# Patient Record
Sex: Female | Born: 1985 | Race: White | Hispanic: No | Marital: Married | Smoking: Former smoker
Health system: Southern US, Community
[De-identification: ages and names within clinical notes are randomized; demographics above are authoritative.]

## PROBLEM LIST (undated history)

## (undated) ENCOUNTER — Inpatient Hospital Stay (HOSPITAL_COMMUNITY): Payer: Self-pay

## (undated) DIAGNOSIS — R569 Unspecified convulsions: Secondary | ICD-10-CM

## (undated) DIAGNOSIS — I10 Essential (primary) hypertension: Secondary | ICD-10-CM

## (undated) DIAGNOSIS — F32A Depression, unspecified: Secondary | ICD-10-CM

## (undated) HISTORY — PX: INNER EAR SURGERY: SHX679

## (undated) HISTORY — PX: NO PAST SURGERIES: SHX2092

---
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
## 2003-01-12 ENCOUNTER — Emergency Department (HOSPITAL_COMMUNITY): Admission: EM | Admit: 2003-01-12 | Discharge: 2003-01-12 | Payer: Self-pay | Admitting: Emergency Medicine

## 2003-01-14 ENCOUNTER — Emergency Department (HOSPITAL_COMMUNITY): Admission: EM | Admit: 2003-01-14 | Discharge: 2003-01-15 | Payer: Self-pay | Admitting: Emergency Medicine

## 2003-03-15 ENCOUNTER — Emergency Department (HOSPITAL_COMMUNITY): Admission: EM | Admit: 2003-03-15 | Discharge: 2003-03-15 | Payer: Self-pay | Admitting: Emergency Medicine

## 2003-03-16 ENCOUNTER — Emergency Department (HOSPITAL_COMMUNITY): Admission: EM | Admit: 2003-03-16 | Discharge: 2003-03-17 | Payer: Self-pay | Admitting: Emergency Medicine

## 2003-03-17 ENCOUNTER — Emergency Department (HOSPITAL_COMMUNITY): Admission: EM | Admit: 2003-03-17 | Discharge: 2003-03-17 | Payer: Self-pay | Admitting: Emergency Medicine

## 2003-04-28 ENCOUNTER — Emergency Department (HOSPITAL_COMMUNITY): Admission: EM | Admit: 2003-04-28 | Discharge: 2003-04-28 | Payer: Self-pay | Admitting: Emergency Medicine

## 2003-05-04 ENCOUNTER — Emergency Department (HOSPITAL_COMMUNITY): Admission: EM | Admit: 2003-05-04 | Discharge: 2003-05-04 | Payer: Self-pay | Admitting: Emergency Medicine

## 2003-05-29 ENCOUNTER — Emergency Department (HOSPITAL_COMMUNITY): Admission: EM | Admit: 2003-05-29 | Discharge: 2003-05-29 | Payer: Self-pay | Admitting: Emergency Medicine

## 2003-08-29 ENCOUNTER — Emergency Department (HOSPITAL_COMMUNITY): Admission: EM | Admit: 2003-08-29 | Discharge: 2003-08-29 | Payer: Self-pay | Admitting: Emergency Medicine

## 2003-10-19 ENCOUNTER — Emergency Department (HOSPITAL_COMMUNITY): Admission: EM | Admit: 2003-10-19 | Discharge: 2003-10-19 | Payer: Self-pay | Admitting: Emergency Medicine

## 2003-11-11 ENCOUNTER — Emergency Department (HOSPITAL_COMMUNITY): Admission: EM | Admit: 2003-11-11 | Discharge: 2003-11-11 | Payer: Self-pay | Admitting: Emergency Medicine

## 2004-05-06 ENCOUNTER — Emergency Department (HOSPITAL_COMMUNITY): Admission: EM | Admit: 2004-05-06 | Discharge: 2004-05-06 | Payer: Self-pay | Admitting: Emergency Medicine

## 2004-09-04 ENCOUNTER — Emergency Department (HOSPITAL_COMMUNITY): Admission: EM | Admit: 2004-09-04 | Discharge: 2004-09-04 | Payer: Self-pay | Admitting: *Deleted

## 2004-10-10 ENCOUNTER — Emergency Department (HOSPITAL_COMMUNITY): Admission: EM | Admit: 2004-10-10 | Discharge: 2004-10-10 | Payer: Self-pay | Admitting: Emergency Medicine

## 2005-01-03 ENCOUNTER — Emergency Department (HOSPITAL_COMMUNITY): Admission: EM | Admit: 2005-01-03 | Discharge: 2005-01-04 | Payer: Self-pay | Admitting: Emergency Medicine

## 2005-04-16 ENCOUNTER — Emergency Department (HOSPITAL_COMMUNITY): Admission: EM | Admit: 2005-04-16 | Discharge: 2005-04-17 | Payer: Self-pay | Admitting: Emergency Medicine

## 2005-08-23 ENCOUNTER — Ambulatory Visit: Payer: Self-pay | Admitting: Psychiatry

## 2005-08-23 ENCOUNTER — Inpatient Hospital Stay (HOSPITAL_COMMUNITY): Admission: AD | Admit: 2005-08-23 | Discharge: 2005-08-26 | Payer: Self-pay | Admitting: Psychiatry

## 2005-08-27 ENCOUNTER — Emergency Department (HOSPITAL_COMMUNITY): Admission: EM | Admit: 2005-08-27 | Discharge: 2005-08-27 | Payer: Self-pay | Admitting: *Deleted

## 2005-08-28 ENCOUNTER — Emergency Department (HOSPITAL_COMMUNITY): Admission: EM | Admit: 2005-08-28 | Discharge: 2005-08-29 | Payer: Self-pay | Admitting: Emergency Medicine

## 2007-02-09 ENCOUNTER — Inpatient Hospital Stay (HOSPITAL_COMMUNITY): Admission: AD | Admit: 2007-02-09 | Discharge: 2007-02-09 | Payer: Self-pay | Admitting: Obstetrics and Gynecology

## 2007-07-08 ENCOUNTER — Inpatient Hospital Stay (HOSPITAL_COMMUNITY): Admission: AD | Admit: 2007-07-08 | Discharge: 2007-07-08 | Payer: Self-pay | Admitting: Obstetrics and Gynecology

## 2007-08-03 ENCOUNTER — Inpatient Hospital Stay (HOSPITAL_COMMUNITY): Admission: AD | Admit: 2007-08-03 | Discharge: 2007-08-03 | Payer: Self-pay | Admitting: Obstetrics and Gynecology

## 2007-08-29 ENCOUNTER — Inpatient Hospital Stay (HOSPITAL_COMMUNITY): Admission: AD | Admit: 2007-08-29 | Discharge: 2007-09-01 | Payer: Self-pay | Admitting: Obstetrics and Gynecology

## 2008-08-25 ENCOUNTER — Emergency Department (HOSPITAL_COMMUNITY): Admission: EM | Admit: 2008-08-25 | Discharge: 2008-08-25 | Payer: Self-pay | Admitting: Emergency Medicine

## 2008-10-11 ENCOUNTER — Emergency Department (HOSPITAL_COMMUNITY): Admission: EM | Admit: 2008-10-11 | Discharge: 2008-10-11 | Payer: Self-pay | Admitting: Emergency Medicine

## 2008-10-11 ENCOUNTER — Emergency Department (HOSPITAL_COMMUNITY): Admission: EM | Admit: 2008-10-11 | Discharge: 2008-10-12 | Payer: Self-pay | Admitting: Emergency Medicine

## 2009-03-03 ENCOUNTER — Emergency Department (HOSPITAL_COMMUNITY): Admission: EM | Admit: 2009-03-03 | Discharge: 2009-03-03 | Payer: Self-pay | Admitting: Emergency Medicine

## 2009-03-05 ENCOUNTER — Emergency Department (HOSPITAL_BASED_OUTPATIENT_CLINIC_OR_DEPARTMENT_OTHER): Admission: EM | Admit: 2009-03-05 | Discharge: 2009-03-05 | Payer: Self-pay | Admitting: Emergency Medicine

## 2009-05-17 ENCOUNTER — Emergency Department (HOSPITAL_COMMUNITY): Admission: EM | Admit: 2009-05-17 | Discharge: 2009-05-17 | Payer: Self-pay | Admitting: Emergency Medicine

## 2010-06-11 NOTE — Discharge Summary (Signed)
Angel Carpenter, Angel Carpenter                 ACCOUNT NO.:  [id number]   MEDICAL RECORD NO.:  [id number]          PATIENT TYPE:  IPS   LOCATION:  0507                          FACILITY:  [facility]   PHYSICIAN:  Anselm Jungling, MD  DATE OF BIRTH:  [birth year]   DATE OF ADMISSION:  08/23/2005  DATE OF DISCHARGE:  08/26/2005                                 DISCHARGE SUMMARY   IDENTIFYING DATA AND REASON FOR ADMISSION:  The patient is a 25 year old  single white female who was admitted after an overdose that she described  only as an effort to sleep and to get attention.  She denied it was an  attempt to end her life.  She did admit to depression.  She had not had any  outpatient treatment for this.  She indicated that she wanted counseling.  She denied suicidal ideation.  She had been living with her mother.  Please  refer to the admission note for further details pertaining to the symptoms,  circumstances and history that led to her hospitalization.  She was given an  initial Axis I diagnosis of rule out major depressive disorder.   MEDICAL AND LABORATORY:  The patient was medically and physically assessed  by the psychiatric nurse practitioner.  She was in good health, but felt to  likely have a urinary tract infection.  For this she was prescribed a 7-day  course of Keflex 500 mg b.i.d..   HOSPITAL COURSE:  The patient was admitted to the adult inpatient  psychiatric service.  She presented as a well-nourished, well-developed  young woman who was pleasant, fully oriented, cooperative and appropriate.  Her mood was depressed with sad affect.  There were no signs or symptoms of  psychosis or thought disorder.  She was absent suicidal ideation throughout  her inpatient stay and regretted her overdose.   The patient initially agreed to allow [place] to contact her mother, who  she had  been living with, but on the second hospital day, her mother indicated that  she had kicked me out of her home, and  stated that this was because she  was dating an [place] American female.  Jakaria made plans to stay with a  girlfriend.  She also was future oriented, in talking about a desire to get  her GED degree and an accounting degree.   She was a good participant in group therapy and got along with peers and  staff alike.   Raya was clear throughout her inpatient stay that she was not really  interested in antidepressant medication trials.  She did indicate  consistently that she wanted individual counseling.   On the fourth hospital day, the patient appeared to be appropriate for  discharge.   AFTERCARE:  The patient was to follow-up with [facility]  mental health  center with an appointment on August 29, 2005, for individual counseling and  other appropriate services.   DISCHARGE MEDICATIONS:  Keflex 500 mg b.i.d. on August 3 through August 7,  then discontinue.  Follow-up with family doctor regarding any further  urinary tract symptoms.  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  AXIS II: Deferred.  AXIS III: Urinary tract infection, resolving.  AXIS IV: Stressors severe.  AXIS V: Global assessment of functioning on discharge 63.           ______________________________  Anselm Jungling, MD  Electronically Signed     SPB/MEDQ  D:  08/29/2005  T:  08/29/2005  Job:  [id number]

## 2010-10-15 LAB — URINALYSIS, ROUTINE W REFLEX MICROSCOPIC
Bilirubin Urine: NEGATIVE
Glucose, UA: NEGATIVE
Ketones, ur: NEGATIVE
Nitrite: NEGATIVE
Protein, ur: 30 — AB
Specific Gravity, Urine: 1.025
Urobilinogen, UA: 0.2
pH: 7

## 2010-10-15 LAB — URINE CULTURE
Colony Count: NO GROWTH
Culture: NO GROWTH

## 2010-10-22 LAB — CBC
HCT: 30 — ABNORMAL LOW
MCHC: 34.2
MCV: 94.5
Platelets: 174
RBC: 3.17 — ABNORMAL LOW
RDW: 14
WBC: 13.8 — ABNORMAL HIGH

## 2010-11-21 ENCOUNTER — Emergency Department (HOSPITAL_COMMUNITY)
Admission: EM | Admit: 2010-11-21 | Discharge: 2010-11-21 | Disposition: A | Discharge status: Home / Self Care | Payer: Self-pay | Attending: Emergency Medicine | Admitting: Emergency Medicine

## 2010-11-21 DIAGNOSIS — F17200 Nicotine dependence, unspecified, uncomplicated: Secondary | ICD-10-CM | POA: Insufficient documentation

## 2010-11-21 DIAGNOSIS — N390 Urinary tract infection, site not specified: Secondary | ICD-10-CM | POA: Insufficient documentation

## 2010-11-21 LAB — URINALYSIS, ROUTINE W REFLEX MICROSCOPIC
Glucose, UA: NEGATIVE mg/dL
Ketones, ur: 40 mg/dL — AB
Specific Gravity, Urine: 1.031 — ABNORMAL HIGH (ref 1.005–1.030)
Urobilinogen, UA: 1 mg/dL (ref 0.0–1.0)
pH: 6 (ref 5.0–8.0)

## 2010-11-21 LAB — CBC
Hemoglobin: 13.5 g/dL (ref 12.0–15.0)
RDW: 11.9 % (ref 11.5–15.5)
WBC: 9.3 10*3/uL (ref 4.0–10.5)

## 2010-11-21 LAB — DIFFERENTIAL
Eosinophils Absolute: 0.1 10*3/uL (ref 0.0–0.7)
Eosinophils Relative: 1 % (ref 0–5)
Lymphocytes Relative: 36 % (ref 12–46)
Monocytes Relative: 5 % (ref 3–12)
Neutrophils Relative %: 58 % (ref 43–77)

## 2010-11-21 LAB — BASIC METABOLIC PANEL
BUN: 14 mg/dL (ref 6–23)
Chloride: 99 mEq/L (ref 96–112)
GFR calc Af Amer: >90 mL/min (ref >90)
GFR calc non Af Amer: >90 mL/min (ref >90)
Glucose, Bld: 82 mg/dL (ref 70–99)

## 2010-11-21 LAB — URINE MICROSCOPIC-ADD ON

## 2010-11-23 LAB — URINE CULTURE

## 2011-03-01 ENCOUNTER — Encounter (HOSPITAL_COMMUNITY): Payer: Self-pay | Admitting: Emergency Medicine

## 2011-03-01 ENCOUNTER — Emergency Department (HOSPITAL_COMMUNITY)
Admission: EM | Admit: 2011-03-01 | Discharge: 2011-03-01 | Disposition: A | Discharge status: Home / Self Care | Payer: Self-pay | Attending: Emergency Medicine | Admitting: Emergency Medicine

## 2011-03-01 DIAGNOSIS — W260XXA Contact with knife, initial encounter: Secondary | ICD-10-CM | POA: Insufficient documentation

## 2011-03-01 DIAGNOSIS — W261XXA Contact with sword or dagger, initial encounter: Secondary | ICD-10-CM | POA: Insufficient documentation

## 2011-03-01 DIAGNOSIS — S61209A Unspecified open wound of unspecified finger without damage to nail, initial encounter: Secondary | ICD-10-CM | POA: Insufficient documentation

## 2011-03-01 DIAGNOSIS — S61218A Laceration without foreign body of other finger without damage to nail, initial encounter: Secondary | ICD-10-CM

## 2011-03-01 MED ORDER — TETANUS-DIPHTH-ACELL PERTUSSIS 5-2.5-18.5 LF-MCG/0.5 IM SUSP
0.5000 mL | Freq: Once | INTRAMUSCULAR | Status: AC
  Administered 2011-03-01: 0.5 mL via INTRAMUSCULAR
  Filled 2011-03-01: qty 0.5

## 2011-03-01 NOTE — ED Notes (Signed)
Bleeding controlled at present. 

## 2011-03-01 NOTE — ED Provider Notes (Signed)
Medical screening examination/treatment/procedure(s) were performed by non-physician practitioner and as supervising physician I was immediately available for consultation/collaboration.   Lyanne Co, MD 03/01/11 [phone]

## 2011-03-01 NOTE — ED Provider Notes (Signed)
History     CSN: [id number]  Arrival date & time 03/01/11  1326   First MD Initiated Contact with Patient 03/01/11 1328      2:22 PM HPI Patient reports yesterday cut her left fourth finger on a box cutter. Patient has a small half inch skin flap that is persistently oozing. Patient reports laceration occurred over 24 hours ago. Patient is a 26 y.o. female presenting with skin laceration. The history is provided by the patient.  Laceration  The incident occurred yesterday. The laceration is 1 cm in size. The laceration mechanism was a a dirty knife. The pain is at a severity of 3/10. The pain is mild. The pain has been constant since onset. She reports no foreign bodies present. Her tetanus status is unknown.    History reviewed. No pertinent past medical history.  History reviewed. No pertinent past surgical history.  Family History  Problem Relation Age of Onset  . Diabetes Father     History  Substance Use Topics  . Smoking status: Current Everyday Smoker  . Smokeless tobacco: Not on file  . Alcohol Use: No    OB History    Grav Para Term Preterm Abortions TAB SAB Ect Mult Living                  Review of Systems  Skin: Negative for color change.       Bleeding Laceration  All other systems reviewed and are negative.    Allergies  Sulfa antibiotics  Home Medications   Current Outpatient Rx  Name Route Sig Dispense Refill  . HYDROCODONE-ACETAMINOPHEN 7.5-325 MG PO TABS Oral Take 1 tablet by mouth every 6 (six) hours as needed. For pain    . IBUPROFEN 200 MG PO TABS Oral Take 400 mg by mouth every 6 (six) hours as needed. For pain    . BACITRACIN-NEOMYCIN-POLYMYXIN 400-05-4998 EX OINT Topical Apply 1 application topically every 12 (twelve) hours.      BP 124/85  Pulse 95  Temp(Src) 98.2 F (36.8 C) (Oral)  Resp 18  SpO2 100%  LMP 03/01/2011  Physical Exam  Vitals reviewed. Constitutional: She is oriented to person, place, and time. Vital signs are  normal. She appears well-developed and well-nourished. No distress.  HENT:  Head: Normocephalic and atraumatic.  Eyes: Pupils are equal, round, and reactive to light.  Neck: Neck supple.  Pulmonary/Chest: Effort normal.  Neurological: She is alert and oriented to person, place, and time.  Skin: Skin is warm and dry. No rash noted. No erythema. No pallor.       Left fourth distal phalanx has a partial avulsion of the distal tip. Laceration approximately half a centimeter. Mildly tender to palpation minimal amount of blood oozing. Tender to palpation. Patient has normal sensation, capillary refill, pulses, strength. Patient also has range of motion  Psychiatric: She has a normal mood and affect. Her behavior is normal.    ED Course  Procedures   MDM   Placed Wound seal on wound. Hemostasis instantly achieved. Will wrap wound with a slight pressure dressing . Advised patient to keep a dressing for 7 days to ensure adherence of flap.       Thomasene Lot, PA-C 03/01/11 1434

## 2011-09-14 ENCOUNTER — Emergency Department (HOSPITAL_BASED_OUTPATIENT_CLINIC_OR_DEPARTMENT_OTHER)
Admission: EM | Admit: 2011-09-14 | Discharge: 2011-09-14 | Disposition: A | Discharge status: Home / Self Care | Payer: Self-pay | Attending: Emergency Medicine | Admitting: Emergency Medicine

## 2011-09-14 ENCOUNTER — Encounter (HOSPITAL_BASED_OUTPATIENT_CLINIC_OR_DEPARTMENT_OTHER): Payer: Self-pay | Admitting: *Deleted

## 2011-09-14 DIAGNOSIS — F17200 Nicotine dependence, unspecified, uncomplicated: Secondary | ICD-10-CM | POA: Insufficient documentation

## 2011-09-14 DIAGNOSIS — Z882 Allergy status to sulfonamides status: Secondary | ICD-10-CM | POA: Insufficient documentation

## 2011-09-14 DIAGNOSIS — L509 Urticaria, unspecified: Secondary | ICD-10-CM | POA: Insufficient documentation

## 2011-09-14 MED ORDER — PREDNISONE 50 MG PO TABS: 60.0000 mg | ORAL_TABLET | Freq: Once | ORAL | Status: DC

## 2011-09-14 MED ORDER — DIPHENHYDRAMINE HCL 25 MG PO CAPS: 50.0000 mg | ORAL_CAPSULE | Freq: Once | ORAL | Status: DC

## 2011-09-14 MED ORDER — PREDNISONE 10 MG PO TABS: 20.0000 mg | ORAL_TABLET | Freq: Two times a day (BID) | ORAL | Status: AC

## 2011-09-14 MED ORDER — DIPHENHYDRAMINE HCL 25 MG PO CAPS
50.0000 mg | ORAL_CAPSULE | Freq: Once | ORAL | Status: AC
  Administered 2011-09-14: 50 mg via ORAL
  Filled 2011-09-14: qty 2

## 2011-09-14 MED ORDER — PREDNISONE 50 MG PO TABS
60.0000 mg | ORAL_TABLET | Freq: Once | ORAL | Status: AC
  Administered 2011-09-14: 60 mg via ORAL
  Filled 2011-09-14: qty 1

## 2011-09-14 NOTE — ED Provider Notes (Signed)
History     CSN: [id number]  Arrival date & time 09/14/11  1104   First MD Initiated Contact with Patient 09/14/11 1128      Chief Complaint  Patient presents with  . Rash    (Consider location/radiation/quality/duration/timing/severity/associated sxs/prior treatment) HPI  Complaining of hives diffusely to upper extremities and legs. She states that she has had hives multiple times in the past. She did sleep on the sheets that she didn't wash last night. She has not had any throat swelling, nasal discharge, cough, dyspnea, lightheadedness, or GI distress. She has not had any B. symptoms in the past. She does have a history of having hives approximately 10 times in the past. She has not followed up with an allergist. She has never had a progression of her symptoms and they have generally resolved with treatment with prednisone and then a drill. She did not take any medications at home prior to coming in.  History reviewed. No pertinent past medical history.  Past Surgical History  Procedure Date  . Inner ear surgery     Left    Family History  Problem Relation Age of Onset  . Diabetes Father     History  Substance Use Topics  . Smoking status: Current Everyday Smoker  . Smokeless tobacco: Not on file  . Alcohol Use: Yes    OB History    Grav Para Term Preterm Abortions TAB SAB Ect Mult Living                  Review of Systems  All other systems reviewed and are negative.    Allergies  Sulfa antibiotics  Home Medications   Current Outpatient Rx  Name Route Sig Dispense Refill  . HYDROCODONE-ACETAMINOPHEN 7.5-325 MG PO TABS Oral Take 1 tablet by mouth every 6 (six) hours as needed. For pain    . IBUPROFEN 200 MG PO TABS Oral Take 400 mg by mouth every 6 (six) hours as needed. For pain    . BACITRACIN-NEOMYCIN-POLYMYXIN 400-05-4998 EX OINT Topical Apply 1 application topically every 12 (twelve) hours.      BP 130/80  Pulse 100  Temp 97.8 F (36.6 C) (Oral)   Resp 18  Ht 5\' 8"  (1.727 m)  Wt 165 lb (74.844 kg)  BMI 25.09 kg/m2  SpO2 100%  Physical Exam  Nursing note and vitals reviewed. Constitutional: She appears well-developed and well-nourished.  HENT:  Head: Normocephalic and atraumatic.  Eyes: Conjunctivae and EOM are normal. Pupils are equal, round, and reactive to light.  Neck: Normal range of motion. Neck supple.  Cardiovascular: Normal rate, regular rhythm, normal heart sounds and intact distal pulses.   Pulmonary/Chest: Effort normal and breath sounds normal.  Abdominal: Soft. Bowel sounds are normal.  Musculoskeletal: Normal range of motion.  Neurological: She is alert.  Skin: Skin is warm and dry.       Hives on legs and arms.  Psychiatric: She has a normal mood and affect. Thought content normal.    ED Course  Procedures (including critical care time)  Labs Reviewed - No data to display No results found.   No diagnosis found.    MDM  Patient with some slight decrease in hives. She has not had any spreading continues to have normal respirations and circulatory exam. She received by mouth prednisone and Benadryl here. I discussed this with both her and her husband. She'll continue on prednisone and Benadryl. She'll be given a referral to an allergist  Hilario Quarry, MD 09/14/11 [phone]

## 2011-09-14 NOTE — ED Notes (Signed)
Patient signed safety contract and states he has no intention of harming himself or anyone else.

## 2011-09-14 NOTE — ED Notes (Signed)
Patient states hives started this morning; she also reports pain with the hives, all over her body.  Patient reports having new sheets on the bed last night, and didn't wash them prior to use.

## 2011-09-14 NOTE — ED Notes (Signed)
Patient reports rash to entire  body, red bumps, no respiratory distress

## 2011-12-13 ENCOUNTER — Emergency Department (HOSPITAL_BASED_OUTPATIENT_CLINIC_OR_DEPARTMENT_OTHER)
Admission: EM | Admit: 2011-12-13 | Discharge: 2011-12-13 | Disposition: A | Discharge status: Home / Self Care | Payer: Self-pay | Attending: Emergency Medicine | Admitting: Emergency Medicine

## 2011-12-13 ENCOUNTER — Encounter (HOSPITAL_BASED_OUTPATIENT_CLINIC_OR_DEPARTMENT_OTHER): Payer: Self-pay | Admitting: *Deleted

## 2011-12-13 DIAGNOSIS — F17200 Nicotine dependence, unspecified, uncomplicated: Secondary | ICD-10-CM | POA: Insufficient documentation

## 2011-12-13 DIAGNOSIS — B009 Herpesviral infection, unspecified: Secondary | ICD-10-CM | POA: Insufficient documentation

## 2011-12-13 DIAGNOSIS — B001 Herpesviral vesicular dermatitis: Secondary | ICD-10-CM

## 2011-12-13 MED ORDER — ACYCLOVIR 200 MG PO CAPS: 200.0000 mg | ORAL_CAPSULE | Freq: Every day | ORAL | Status: DC

## 2011-12-13 MED ORDER — HYDROCODONE-ACETAMINOPHEN 5-325 MG PO TABS: 2.0000 | ORAL_TABLET | ORAL | Status: DC | PRN

## 2011-12-13 NOTE — ED Notes (Signed)
Pt woke up yesterday morning with upper lip covered in cold sores states the only thing she did differently was use a new chap stick before going to bed when she woke up lip swollen and covered in blisters

## 2011-12-13 NOTE — ED Provider Notes (Signed)
Medical screening examination/treatment/procedure(s) were performed by non-physician practitioner and as supervising physician I was immediately available for consultation/collaboration.   Florice Hindle, MD 12/13/11 1534 

## 2011-12-13 NOTE — ED Provider Notes (Signed)
History     CSN: [id number]  Arrival date & time 12/13/11  1209   First MD Initiated Contact with Patient 12/13/11 1253      Chief Complaint  Patient presents with  . Mouth Lesions    (Consider location/radiation/quality/duration/timing/severity/associated sxs/prior treatment) Patient is a 26 y.o. female presenting with mouth sores. The history is provided by the patient. No language interpreter was used.  Mouth Lesions  The current episode started yesterday. The problem occurs continuously. The problem has been gradually worsening. The problem is moderate. Nothing relieves the symptoms. Nothing aggravates the symptoms. Associated symptoms include mouth sores and URI. Pertinent negatives include no fever and no cough. Urine output has been normal.  Pt complains of sores on her mouth  History reviewed. No pertinent past medical history.  Past Surgical History  Procedure Date  . Inner ear surgery     Left    Family History  Problem Relation Age of Onset  . Diabetes Father     History  Substance Use Topics  . Smoking status: Current Every Day Smoker  . Smokeless tobacco: Not on file  . Alcohol Use: Yes    OB History    Grav Para Term Preterm Abortions TAB SAB Ect Mult Living                  Review of Systems  Constitutional: Negative for fever.  HENT: Positive for mouth sores.   Respiratory: Negative for cough.   All other systems reviewed and are negative.    Allergies  Sulfa antibiotics  Home Medications   Current Outpatient Rx  Name  Route  Sig  Dispense  Refill  . HYDROCODONE-ACETAMINOPHEN 7.5-325 MG PO TABS   Oral   Take 1 tablet by mouth every 6 (six) hours as needed. For pain         . IBUPROFEN 200 MG PO TABS   Oral   Take 400 mg by mouth every 6 (six) hours as needed. For pain         . BACITRACIN-NEOMYCIN-POLYMYXIN 400-05-4998 EX OINT   Topical   Apply 1 application topically every 12 (twelve) hours.           BP 135/83  Pulse  119  Temp 98.5 F (36.9 C) (Oral)  Resp 20  Ht 5\' 8"  (1.727 m)  Wt 167 lb (75.751 kg)  BMI 25.39 kg/m2  SpO2 100%  Physical Exam  Nursing note and vitals reviewed. Constitutional: She appears well-developed and well-nourished.  HENT:  Head: Normocephalic and atraumatic.  Right Ear: External ear normal.  Left Ear: External ear normal.  Nose: Nose normal.  Mouth/Throat: Oropharyngeal exudate present.  Eyes: Conjunctivae normal and EOM are normal. Pupils are equal, round, and reactive to light.  Neck: Normal range of motion. Neck supple.  Cardiovascular: Normal rate.   Pulmonary/Chest: Effort normal and breath sounds normal.  Abdominal: Soft.  Musculoskeletal: Normal range of motion.  Neurological: She is alert.    ED Course  Procedures (including critical care time)  Labs Reviewed - No data to display No results found.   No diagnosis found.    MDM  acyclovir        Lonia Skinner [place], [place] 12/13/11 1353

## 2011-12-19 LAB — VIRAL CULTURE VIRC: Special Requests: NORMAL

## 2012-09-26 LAB — OB RESULTS CONSOLE RPR: RPR: NONREACTIVE

## 2012-09-26 LAB — OB RESULTS CONSOLE ABO/RH: RH Type: POSITIVE

## 2012-09-26 LAB — OB RESULTS CONSOLE HEPATITIS B SURFACE ANTIGEN: Hepatitis B Surface Ag: NEGATIVE

## 2012-09-26 LAB — OB RESULTS CONSOLE RUBELLA ANTIBODY, IGM: RUBELLA: IMMUNE

## 2012-09-26 LAB — OB RESULTS CONSOLE HIV ANTIBODY (ROUTINE TESTING): HIV: NONREACTIVE

## 2013-01-24 NOTE — L&D Delivery Note (Signed)
Delivery Note At 6:01 PM a viable female was delivered via normal spontaneous vaginal delivery (Presentation: vertex loa;  ).  APGAR:8/9 , ; weight . pending Placenta status:intact , .3 vessel  Cord:  with the following complications:none .  Cord pH: na  Anesthesia: Epidural  Episiotomy: none Lacerations: none Suture Repair: na Est. Blood Loss (mL): 350cc  Mom to postpartum.  Baby to Couplet care / Skin to Skin.  Angel GibneyJohn S Leily Carpenter 05/03/2013, 6:10 PM

## 2013-02-25 ENCOUNTER — Encounter (HOSPITAL_COMMUNITY): Payer: Self-pay

## 2013-02-25 ENCOUNTER — Inpatient Hospital Stay (HOSPITAL_COMMUNITY)
Admission: AD | Admit: 2013-02-25 | Discharge: 2013-02-27 | DRG: 782 | Disposition: A | Discharge status: Home / Self Care | Payer: 59 | Source: Ambulatory Visit | Attending: Obstetrics and Gynecology | Admitting: Obstetrics and Gynecology

## 2013-02-25 DIAGNOSIS — O4690 Antepartum hemorrhage, unspecified, unspecified trimester: Secondary | ICD-10-CM | POA: Diagnosis present

## 2013-02-25 DIAGNOSIS — O4590 Premature separation of placenta, unspecified, unspecified trimester: Principal | ICD-10-CM | POA: Diagnosis present

## 2013-02-25 LAB — CBC
HCT: 31.2 % — ABNORMAL LOW (ref 36.0–46.0)
HEMOGLOBIN: 11.1 g/dL — AB (ref 12.0–15.0)
MCH: 32.2 pg (ref 26.0–34.0)
MCHC: 35.6 g/dL (ref 30.0–36.0)
MCV: 90.4 fL (ref 78.0–100.0)
PLATELETS: 169 10*3/uL (ref 150–400)
RBC: 3.45 MIL/uL — AB (ref 3.87–5.11)
RDW: 12.4 % (ref 11.5–15.5)
WBC: 10.9 10*3/uL — AB (ref 4.0–10.5)

## 2013-02-25 LAB — TYPE AND SCREEN
ABO/RH(D): O POS
Antibody Screen: NEGATIVE

## 2013-02-25 LAB — ABO/RH: ABO/RH(D): O POS

## 2013-02-25 MED ORDER — CALCIUM CARBONATE ANTACID 500 MG PO CHEW
2.0000 | CHEWABLE_TABLET | ORAL | Status: DC | PRN
  Administered 2013-02-26: 400 mg via ORAL
  Filled 2013-02-25: qty 1

## 2013-02-25 MED ORDER — ACETAMINOPHEN 325 MG PO TABS: 650.0000 mg | ORAL_TABLET | ORAL | Status: DC | PRN

## 2013-02-25 MED ORDER — BETAMETHASONE SOD PHOS & ACET 6 (3-3) MG/ML IJ SUSP
12.0000 mg | INTRAMUSCULAR | Status: AC
  Administered 2013-02-25 – 2013-02-26 (×2): 12 mg via INTRAMUSCULAR
  Filled 2013-02-25 (×2): qty 2

## 2013-02-25 MED ORDER — DOCUSATE SODIUM 100 MG PO CAPS
100.0000 mg | ORAL_CAPSULE | Freq: Every day | ORAL | Status: DC
  Administered 2013-02-25 – 2013-02-26 (×2): 100 mg via ORAL
  Filled 2013-02-25 (×2): qty 1

## 2013-02-25 MED ORDER — PRENATAL MULTIVITAMIN CH
1.0000 | ORAL_TABLET | Freq: Every day | ORAL | Status: DC
  Administered 2013-02-26: 1 via ORAL
  Filled 2013-02-25: qty 1

## 2013-02-25 MED ORDER — ZOLPIDEM TARTRATE 5 MG PO TABS
5.0000 mg | ORAL_TABLET | Freq: Every evening | ORAL | Status: DC | PRN
  Administered 2013-02-25: 5 mg via ORAL
  Filled 2013-02-25 (×2): qty 1

## 2013-02-25 NOTE — Progress Notes (Signed)
Patient ID: Angel NuttingJoni Carpenter, female   DOB: 01/08/1986, 28 y.o.   MRN: [id number] Pregnancy at 29.5 with bleeding this weekend sono in office 3.2 x 7 mm area of abruption in lower right edge of placenta Cervical length 4.3 cm no funneling On exam no evidence of bleeding cervix long and closed Admitted for monitoring and steriods sono in am e

## 2013-02-25 NOTE — Plan of Care (Signed)
Problem: Consults Goal: Birthing Suites Patient Information Press F2 to bring up selections list   Pt < [redacted] weeks EGA     

## 2013-02-26 ENCOUNTER — Inpatient Hospital Stay (HOSPITAL_COMMUNITY): Payer: 59

## 2013-02-26 NOTE — Progress Notes (Signed)
30 0/7 weeks No bleeding No pain, "sore" across lower abdomen  VSS Afeb Uterus soft, NT  FHT + accels UCs none  A: Marginal Abruption on U/S  P: Second BMTZ this pm      U/S today      Continue MBR and observation

## 2013-02-26 NOTE — H&P (Signed)
NAMRomie Jumper:  Carpenter, Angel                ACCOUNT NO.:  [id number]  MEDICAL RECORD NO.:  [id number]  LOCATION:                                 FACILITY:  PHYSICIAN:  Juluis MireJohn S. Mele Sylvester, M.D.   DATE OF BIRTH:  08/18/1985  DATE OF ADMISSION:  02/25/2013 DATE OF DISCHARGE:                             HISTORY & PHYSICAL   HISTORY OF PRESENT ILLNESS:  She is a 28 year old, gravida 2, para 1 female at 5229 and 5/7th weeks.  She had some bleeding over the weekend. On initial evaluation in the office, there was no active bleeding.  The mucus was clear of white circum and cervix was long and closed.  We did check an ultrasound.  Cervical length was 4.3 cm with no funneling, but there may have been a small area of abruption measuring 3.2 x 7 mm.  In view of this, we are going to bring her in for monitoring steroids and repeat ultrasound tomorrow.  Her blood type is O positive.  ALLERGIES:  She is allergic to sulfa.  MEDICATIONS:  Prenatal vitamins.  PAST MEDICAL HISTORY:  Please see prenatal records.  FAMILY HISTORY:  Please see prenatal records.  SOCIAL HISTORY:  Please see prenatal records.  REVIEW OF SYSTEMS:  Noncontributory.  PHYSICAL EXAMINATION:  VITAL SIGNS:  The patient is afebrile, stable vital signs. HEENT:  The patient is normocephalic.  Pupils equal, round, and reactive to light and accommodation.  Extraocular movements were intact.  Sclerae and conjunctivae are clear.  Oropharynx clear. NECK:  Not examined. BREASTS:  Not examined. LUNGS:  Clear. CARDIOVASCULAR:  Regular rate without murmurs or gallops.  No carotid bruits. ABDOMINAL:  Gravid uterus consistent with dates.  Fetal heart tones on audible. GU:  On pelvic exam, no active bleeding.  Cervix is long and closed. EXTREMITIES:  Trace edema. NEUROLOGIC:  Grossly normal limits.  IMPRESSION:  Intrauterine pregnancy at 8429 and 5/7th with possible small abruption.  PLAN:  The patient be put on the monitor look for  contractions.  If this is negative, this will be discontinued.  We will check a CBC on her.  We will give her steroids to accelerate lung maturation, discussed this with her and the risks and benefits explained, tomorrow repeat the ultrasound just to assess the placental area.     Juluis MireJohn S. Jenene Kauffmann, M.D.     JSM/MEDQ  D:  02/25/2013  T:  02/25/2013  Job:  [id number]

## 2013-02-27 NOTE — Discharge Summary (Signed)
Physician Discharge Summary  Patient ID: Angel NuttingJoni Carpenter MRN: [id number] DOB/AGE: 30/01/1985 30 y.o.  Admit date: 02/25/2013 Discharge date: 02/27/2013  Admission Diagnoses:  Discharge Diagnoses:  Active Problems:   Fetus affected by placental abruption   Discharged Condition: stable  Hospital Course: Pt was admitted to the hospital with suspected abruption after an episode of VB this past weekend.  Pt denies ctx and lof.  Good FM.  During her hospitalization, she received a course of betamethasone.  On HD#2, repeat US done with MFM did not confirm abruption but did show a placental lake.  Plan was made for discharge home with close outpt follow up  Consults: None  Significant Diagnostic Studies: ultrasound  Treatments: steroids: BMZ  Discharge Exam: Blood pressure 114/76, pulse 93, temperature 97.5 F (36.4 C), temperature source Oral, resp. rate 20, height 5\' 8"  (1.727 m), weight 80.74 kg (178 lb), last menstrual period 07/31/2012. General appearance: alert and cooperative GI: gravid, NT  Disposition: 01-Home or Self Care     Medication List         acetaminophen 500 MG tablet  Commonly known as:  TYLENOL  Take 1,000 mg by mouth every 6 (six) hours as needed for headache.     ranitidine 150 MG tablet  Commonly known as:  ZANTAC  Take 150 mg by mouth at bedtime.     sertraline 50 MG tablet  Commonly known as:  ZOLOFT  Take 50 mg by mouth daily as needed (For depression.).           Follow-up Information   Schedule an appointment as soon as possible for a visit in 1 week to follow up.      Signed: Lakhia Gengler 02/27/2013, 8:19 AM

## 2013-02-27 NOTE — Progress Notes (Signed)
Pt given discharge information and verlbalizes understanding.

## 2013-02-27 NOTE — Plan of Care (Signed)
Problem: Consults Goal: Neonatologist Consult Outcome: Not Met (add Reason) Not ordered./

## 2013-02-27 NOTE — Progress Notes (Signed)
Pt off the monitor.

## 2013-02-27 NOTE — Discharge Instructions (Signed)
Vaginal Bleeding During Pregnancy A small amount of bleeding from the vagina can happen anytime during pregnancy. It usually stops on its own. However, some bleeding can be serious. Be sure to tell your doctor about all vaginal bleeding. HOME CARE  Get plenty of rest and sleep.  Stay in bed and only get up to go to the bathroom as told by your doctor.  Write down the number of pads you use each day. Note how soaked they are.  Do not use tampons. Do not clean the vagina with a stream of water (douche).  Do not have sex (intercourse) or put anything into your vagina. Have this approved by your doctor.  Save any tissue that comes from your vagina. Show it to your doctor.  Only take medicine as told by your doctor.  Follow your doctor's advice about lifting, driving, and physical activity. GET HELP RIGHT AWAY IF:   You feel your baby move less or not at all.  You pass out (faint) while going to the bathroom.  You have more bleeding.  You start to have contractions.  You have severe cramps in your stomach, back, or belly (abdomen).  You are leaking fluid or have a gush of fluid from your vagina.  You become lightheaded or weak.  You have chills.  You have clumps of tissue or blood clots coming from your vagina.  You have a fever. MAKE SURE YOU:   Understand these instructions.  Will watch your condition.  Will get help right away if you are not doing well or get worse.  Fetal movement counts daily Document Released: 10/20/2007 Document Revised: 12/28/2011 Document Reviewed: 10/31/2011 [facility] Patient Information 2014 [place], [place].

## 2013-03-09 ENCOUNTER — Inpatient Hospital Stay (HOSPITAL_COMMUNITY)
Admission: AD | Admit: 2013-03-09 | Discharge: 2013-03-09 | Disposition: A | Discharge status: Home / Self Care | Payer: 59 | Source: Ambulatory Visit | Attending: Obstetrics and Gynecology | Admitting: Obstetrics and Gynecology

## 2013-03-09 ENCOUNTER — Inpatient Hospital Stay (HOSPITAL_COMMUNITY): Payer: 59

## 2013-03-09 ENCOUNTER — Encounter (HOSPITAL_COMMUNITY): Payer: Self-pay

## 2013-03-09 DIAGNOSIS — O99330 Smoking (tobacco) complicating pregnancy, unspecified trimester: Secondary | ICD-10-CM | POA: Insufficient documentation

## 2013-03-09 DIAGNOSIS — R109 Unspecified abdominal pain: Secondary | ICD-10-CM | POA: Insufficient documentation

## 2013-03-09 DIAGNOSIS — O4690 Antepartum hemorrhage, unspecified, unspecified trimester: Secondary | ICD-10-CM | POA: Insufficient documentation

## 2013-03-09 LAB — URINALYSIS, ROUTINE W REFLEX MICROSCOPIC
BILIRUBIN URINE: NEGATIVE
Glucose, UA: NEGATIVE mg/dL
Hgb urine dipstick: NEGATIVE
Ketones, ur: NEGATIVE mg/dL
Leukocytes, UA: NEGATIVE
Nitrite: NEGATIVE
Protein, ur: NEGATIVE mg/dL
Urobilinogen, UA: 0.2 mg/dL (ref 0.0–1.0)
pH: 6 (ref 5.0–8.0)

## 2013-03-09 NOTE — MAU Note (Signed)
Pt presents complaining of bright red vaginal bleeding when she wipes that started today and lower abdominal pain. Pt is not wearing a pad. Reports good fetal movement.

## 2013-03-09 NOTE — MAU Provider Note (Signed)
  History     CSN: [id number]  Arrival date and time: 03/09/13 1108   First Provider Initiated Contact with Patient 03/09/13 1216      Chief Complaint  Patient presents with  . Vaginal Bleeding  . Abdominal Pain   HPI Angel Carpenter 28 y.o. 423w4d  Comes to MAU with red vaginal bleeding when she wiped this morning.  No bleeding noted when she urinated at MAU on arrival.  Hospitalized on 02-25-13 for possible abruption vs. [place].  Very mild abdominal pain this AM.  No contractions palpated.  OB History   Grav Para Term Preterm Abortions TAB SAB Ect Mult Living   2 1 1  0 0 0 0 0 0 1      History reviewed. No pertinent past medical history.  Past Surgical History  Procedure Laterality Date  . Inner ear surgery      Left  . No past surgeries      Family History  Problem Relation Age of Onset  . Diabetes Father     History  Substance Use Topics  . Smoking status: Current Every Day Smoker  . Smokeless tobacco: Not on file  . Alcohol Use: Yes    Allergies:  Allergies  Allergen Reactions  . Sulfa Antibiotics Swelling    Mouth breaks out    Prescriptions prior to admission  Medication Sig Dispense Refill  . acetaminophen (TYLENOL) 500 MG tablet Take 1,000 mg by mouth every 6 (six) hours as needed for moderate pain or headache.       . calcium carbonate (TUMS - DOSED IN MG ELEMENTAL CALCIUM) 500 MG chewable tablet Chew 2 tablets by mouth at bedtime.      Tery Sanfilippo. Docusate Sodium (COLACE PO) Take 2 tablets by mouth at bedtime. constipation      . sertraline (ZOLOFT) 50 MG tablet Take 50 mg by mouth daily as needed (For depression.).        Review of Systems  Constitutional: Negative for fever.  Gastrointestinal: Negative for nausea, vomiting and abdominal pain.  Genitourinary:       Vaginal bleeding   Physical Exam   Blood pressure 122/81, pulse 109, temperature 97.9 F (36.6 C), temperature source Oral, resp. rate 20, last menstrual period 07/31/2012.  Physical  Exam  Nursing note and vitals reviewed. Constitutional: She is oriented to person, place, and time. She appears well-developed and well-nourished. No distress.  HENT:  Head: Normocephalic.  Eyes: EOM are normal.  Neck: Neck supple.  GI: Soft. There is no tenderness. There is no rebound and no guarding.  FHT Baseline 135 with moderate variability.  Accels of 15x 15 noted. Reactive tracing.  Genitourinary:  Speculum exam: Vagina - Small amount of creamy discharge, no odor, no bleeding noted Cervix - No contact bleeding, appears closed Chaperone present for exam.  Musculoskeletal: Normal range of motion.  Neurological: She is alert and oriented to person, place, and time.  Skin: Skin is warm and dry.  Psychiatric: She has a normal mood and affect.    MAU Course  Procedures  MDM Consult with Dr. Henderson Cloudomblin at 12:38 Ultrasound report reviewed with Dr. Henderson Cloudomblin 1512  Assessment and Plan  Vaginal bleeding - currently resolved.  Plan Will discharge. Follow up in the office as scheduled.   Dewain Platz 03/09/2013, 12:39 PM

## 2013-03-09 NOTE — Discharge Instructions (Signed)
Keep your appointments as scheduled in the office. Call your doctor if your symptoms worsen. Pelvic rest.

## 2013-03-30 ENCOUNTER — Inpatient Hospital Stay (HOSPITAL_COMMUNITY)
Admission: AD | Admit: 2013-03-30 | Discharge: 2013-03-30 | Disposition: A | Discharge status: Home / Self Care | Payer: 59 | Source: Ambulatory Visit | Attending: Obstetrics and Gynecology | Admitting: Obstetrics and Gynecology

## 2013-03-30 ENCOUNTER — Encounter (HOSPITAL_COMMUNITY): Payer: Self-pay | Admitting: *Deleted

## 2013-03-30 DIAGNOSIS — N898 Other specified noninflammatory disorders of vagina: Secondary | ICD-10-CM

## 2013-03-30 DIAGNOSIS — O4290 Premature rupture of membranes, unspecified as to length of time between rupture and onset of labor, unspecified weeks of gestation: Secondary | ICD-10-CM | POA: Insufficient documentation

## 2013-03-30 DIAGNOSIS — O9989 Other specified diseases and conditions complicating pregnancy, childbirth and the puerperium: Secondary | ICD-10-CM

## 2013-03-30 DIAGNOSIS — O99330 Smoking (tobacco) complicating pregnancy, unspecified trimester: Secondary | ICD-10-CM | POA: Insufficient documentation

## 2013-03-30 DIAGNOSIS — O26893 Other specified pregnancy related conditions, third trimester: Secondary | ICD-10-CM

## 2013-03-30 LAB — WET PREP, GENITAL
Clue Cells Wet Prep HPF POC: NONE SEEN
Trich, Wet Prep: NONE SEEN
Yeast Wet Prep HPF POC: NONE SEEN

## 2013-03-30 NOTE — Discharge Instructions (Signed)
Third Trimester of Pregnancy  The third trimester is from week 29 through week 42, months 7 through 9. The third trimester is a time when the fetus is growing rapidly. At the end of the ninth month, the fetus is about 20 inches in length and weighs 6 10 pounds.   BODY CHANGES  Your body goes through many changes during pregnancy. The changes vary from woman to woman.    Your weight will continue to increase. You can expect to gain 25 35 pounds (11 16 kg) by the end of the pregnancy.   You may begin to get stretch marks on your hips, abdomen, and breasts.   You may urinate more often because the fetus is moving lower into your pelvis and pressing on your bladder.   You may develop or continue to have heartburn as a result of your pregnancy.   You may develop constipation because certain hormones are causing the muscles that push waste through your intestines to slow down.   You may develop hemorrhoids or swollen, bulging veins (varicose veins).   You may have pelvic pain because of the weight gain and pregnancy hormones relaxing your joints between the bones in your pelvis. Back aches may result from over exertion of the muscles supporting your posture.   Your breasts will continue to grow and be tender. A yellow discharge may leak from your breasts called colostrum.   Your belly button may stick out.   You may feel short of breath because of your expanding uterus.   You may notice the fetus "dropping," or moving lower in your abdomen.   You may have a bloody mucus discharge. This usually occurs a few days to a week before labor begins.   Your cervix becomes thin and soft (effaced) near your due date.  WHAT TO EXPECT AT YOUR PRENATAL EXAMS   You will have prenatal exams every 2 weeks until week 36. Then, you will have weekly prenatal exams. During a routine prenatal visit:   You will be weighed to make sure you and the fetus are growing normally.   Your blood pressure is taken.   Your abdomen will be  measured to track your baby's growth.   The fetal heartbeat will be listened to.   Any test results from the previous visit will be discussed.   You may have a cervical check near your due date to see if you have effaced.  At around 36 weeks, your caregiver will check your cervix. At the same time, your caregiver will also perform a test on the secretions of the vaginal tissue. This test is to determine if a type of bacteria, Group B streptococcus, is present. Your caregiver will explain this further.  Your caregiver may ask you:   What your birth plan is.   How you are feeling.   If you are feeling the baby move.   If you have had any abnormal symptoms, such as leaking fluid, bleeding, severe headaches, or abdominal cramping.   If you have any questions.  Other tests or screenings that may be performed during your third trimester include:   Blood tests that check for low iron levels (anemia).   Fetal testing to check the health, activity level, and growth of the fetus. Testing is done if you have certain medical conditions or if there are problems during the pregnancy.  FALSE LABOR  You may feel small, irregular contractions that eventually go away. These are called Braxton Hicks contractions, or   false labor. Contractions may last for hours, days, or even weeks before true labor sets in. If contractions come at regular intervals, intensify, or become painful, it is best to be seen by your caregiver.   SIGNS OF LABOR    Menstrual-like cramps.   Contractions that are 5 minutes apart or less.   Contractions that start on the top of the uterus and spread down to the lower abdomen and back.   A sense of increased pelvic pressure or back pain.   A watery or bloody mucus discharge that comes from the vagina.  If you have any of these signs before the 37th week of pregnancy, call your caregiver right away. You need to go to the hospital to get checked immediately.  HOME CARE INSTRUCTIONS    Avoid all  smoking, herbs, alcohol, and unprescribed drugs. These chemicals affect the formation and growth of the baby.   Follow your caregiver's instructions regarding medicine use. There are medicines that are either safe or unsafe to take during pregnancy.   Exercise only as directed by your caregiver. Experiencing uterine cramps is a good sign to stop exercising.   Continue to eat regular, healthy meals.   Wear a good support bra for breast tenderness.   Do not use hot tubs, steam rooms, or saunas.   Wear your seat belt at all times when driving.   Avoid raw meat, uncooked cheese, cat litter boxes, and soil used by cats. These carry germs that can cause birth defects in the baby.   Take your prenatal vitamins.   Try taking a stool softener (if your caregiver approves) if you develop constipation. Eat more high-fiber foods, such as fresh vegetables or fruit and whole grains. Drink plenty of fluids to keep your urine clear or pale yellow.   Take warm sitz baths to soothe any pain or discomfort caused by hemorrhoids. Use hemorrhoid cream if your caregiver approves.   If you develop varicose veins, wear support hose. Elevate your feet for 15 minutes, 3 4 times a day. Limit salt in your diet.   Avoid heavy lifting, wear low heal shoes, and practice good posture.   Rest a lot with your legs elevated if you have leg cramps or low back pain.   Visit your dentist if you have not gone during your pregnancy. Use a soft toothbrush to brush your teeth and be gentle when you floss.   A sexual relationship may be continued unless your caregiver directs you otherwise.   Do not travel far distances unless it is absolutely necessary and only with the approval of your caregiver.   Take prenatal classes to understand, practice, and ask questions about the labor and delivery.   Make a trial run to the hospital.   Pack your hospital bag.   Prepare the baby's nursery.   Continue to go to all your prenatal visits as directed  by your caregiver.  SEEK MEDICAL CARE IF:   You are unsure if you are in labor or if your water has broken.   You have dizziness.   You have mild pelvic cramps, pelvic pressure, or nagging pain in your abdominal area.   You have persistent nausea, vomiting, or diarrhea.   You have a bad smelling vaginal discharge.   You have pain with urination.  SEEK IMMEDIATE MEDICAL CARE IF:    You have a fever.   You are leaking fluid from your vagina.   You have spotting or bleeding from your vagina.     You have severe abdominal cramping or pain.   You have rapid weight loss or gain.   You have shortness of breath with chest pain.   You notice sudden or extreme swelling of your face, hands, ankles, feet, or legs.   You have not felt your baby move in over an hour.   You have severe headaches that do not go away with medicine.   You have vision changes.  Document Released: 01/04/2001 Document Revised: 09/12/2012 Document Reviewed: 03/13/2012  [employer] Patient Information 2014 [employer].

## 2013-03-30 NOTE — MAU Provider Note (Signed)
Chief Complaint:  Rupture of Membranes   Angel Carpenter is a 28 y.o.  G2P1001 with IUP at 692w4d presenting for Rupture of Membranes  Pt states she was at work today on her feet when all of a sudden she felt something come out. Felt like water. When she went to the bathroom her underwear were wet but it had not soaked through to her pants. Since then she has not had any more leaking. Decided to come because she was afraid her water had broken.    No ctx, vb. +FM.  Some vaginal pressure.    Pt    Menstrual History: OB History   Grav Para Term Preterm Abortions TAB SAB Ect Mult Living   2 1 1  0 0 0 0 0 0 1        Patient's last menstrual period was 07/31/2012.      History reviewed. No pertinent past medical history.  Past Surgical History  Procedure Laterality Date  . Inner ear surgery      Left  . No past surgeries      Family History  Problem Relation Age of Onset  . Diabetes Father     History  Substance Use Topics  . Smoking status: Current Every Day Smoker  . Smokeless tobacco: Not on file  . Alcohol Use: Yes      Allergies  Allergen Reactions  . Sulfa Antibiotics Swelling    Mouth breaks out    Prescriptions prior to admission  Medication Sig Dispense Refill  . acetaminophen (TYLENOL) 500 MG tablet Take 1,000 mg by mouth every 6 (six) hours as needed for moderate pain or headache.       . phenylephrine-shark liver oil-mineral oil-petrolatum (PREPARATION H) 0.25-3-14-71.9 % rectal ointment Place 1 application rectally 2 (two) times daily as needed for hemorrhoids.      . sertraline (ZOLOFT) 50 MG tablet Take 25 mg by mouth daily as needed (For depression.).         Review of Systems - Negative except for what is mentioned in HPI.  Physical Exam  Last menstrual period 07/31/2012. There were no vitals filed for this visit.  GENERAL: Well-developed, well-nourished female in no acute distress.  LUNGS: Clear to auscultation bilaterally.  HEART: Regular  rate and rhythm. ABDOMEN: Soft, nontender, nondistended, gravid.  EXTREMITIES: Nontender, no edema, 2+ distal pulses. GU: NEFG, physiologic discharge in the vault. Cervix appears closed.  Neg pool even with valsalva. Neg Fern.   Cervical Exam: Dilatation 0cm   Effacement thick%   Station high   FHT:  Baseline rate 130 bpm   Variability moderate  Accelerations present   Decelerations none Contractions: one on TOCO   Labs: No results found for this or any previous visit (from the past 24 hour(s)).  Imaging Studies:  [place] Ob Follow Up  03/09/2013   OBSTETRICAL ULTRASOUND: This exam was performed within a [facility]. The OB US report was generated in the [facility] system, and faxed to the ordering physician.   This report is also available in [facility]'s [employer] and in the [facility] PACS. See [facility] Obstetric US report.   Assessment: Angel Carpenter is  28 y.o. G2P1001 at 292w4d presents with Rupture of Membranes .  Plan: - neg pool and fern - wet prep sent - reassurance given to pt - reasons to return discussed and preterm labor precautions discussed.   F/u as scheduled in the clinic.   FWB- cat I tracing   Discussed with  Dr. Margarite Gouge, Angel Carpenter 3/7/20154:52 PM

## 2013-04-09 LAB — OB RESULTS CONSOLE GBS: GBS: NEGATIVE

## 2013-05-02 ENCOUNTER — Inpatient Hospital Stay (HOSPITAL_COMMUNITY)
Admission: RE | Admit: 2013-05-02 | Discharge: 2013-05-05 | DRG: 775 | Disposition: A | Discharge status: Home / Self Care | Payer: Medicaid Other | Source: Ambulatory Visit | Attending: Obstetrics and Gynecology | Admitting: Obstetrics and Gynecology

## 2013-05-02 ENCOUNTER — Encounter (HOSPITAL_COMMUNITY): Payer: Self-pay

## 2013-05-02 DIAGNOSIS — O99334 Smoking (tobacco) complicating childbirth: Secondary | ICD-10-CM | POA: Diagnosis present

## 2013-05-02 DIAGNOSIS — O139 Gestational [pregnancy-induced] hypertension without significant proteinuria, unspecified trimester: Principal | ICD-10-CM | POA: Diagnosis present

## 2013-05-02 LAB — CBC
HCT: 32.3 % — ABNORMAL LOW (ref 36.0–46.0)
Hemoglobin: 11.4 g/dL — ABNORMAL LOW (ref 12.0–15.0)
MCH: 32.7 pg (ref 26.0–34.0)
MCHC: 35.3 g/dL (ref 30.0–36.0)
MCV: 92.6 fL (ref 78.0–100.0)
Platelets: 211 10*3/uL (ref 150–400)
RBC: 3.49 MIL/uL — ABNORMAL LOW (ref 3.87–5.11)
RDW: 13.3 % (ref 11.5–15.5)
WBC: 11.2 10*3/uL — ABNORMAL HIGH (ref 4.0–10.5)

## 2013-05-02 LAB — OB RESULTS CONSOLE GC/CHLAMYDIA
Chlamydia: NEGATIVE
Gonorrhea: NEGATIVE

## 2013-05-02 MED ORDER — LACTATED RINGERS IV SOLN
INTRAVENOUS | Status: DC
  Administered 2013-05-02 – 2013-05-03 (×4): via INTRAVENOUS

## 2013-05-02 MED ORDER — ZOLPIDEM TARTRATE 5 MG PO TABS: 5.0000 mg | ORAL_TABLET | Freq: Every evening | ORAL | Status: DC | PRN

## 2013-05-02 MED ORDER — MISOPROSTOL 25 MCG QUARTER TABLET
25.0000 ug | ORAL_TABLET | Freq: Four times a day (QID) | ORAL | Status: DC | PRN
  Administered 2013-05-02: 25 ug via VAGINAL
  Filled 2013-05-02: qty 0.25

## 2013-05-02 MED ORDER — CITRIC ACID-SODIUM CITRATE 334-500 MG/5ML PO SOLN
30.0000 mL | ORAL | Status: DC | PRN
  Administered 2013-05-02: 30 mL via ORAL
  Filled 2013-05-02: qty 15

## 2013-05-02 MED ORDER — OXYTOCIN 40 UNITS IN LACTATED RINGERS INFUSION - SIMPLE MED
62.5000 mL/h | INTRAVENOUS | Status: DC
  Administered 2013-05-03: 62.5 mL/h via INTRAVENOUS

## 2013-05-02 MED ORDER — OXYCODONE-ACETAMINOPHEN 5-325 MG PO TABS: 1.0000 | ORAL_TABLET | ORAL | Status: DC | PRN

## 2013-05-02 MED ORDER — LACTATED RINGERS IV SOLN: 500.0000 mL | INTRAVENOUS | Status: DC | PRN

## 2013-05-02 MED ORDER — TERBUTALINE SULFATE 1 MG/ML IJ SOLN: 0.2500 mg | Freq: Once | INTRAMUSCULAR | Status: AC | PRN

## 2013-05-02 MED ORDER — OXYTOCIN BOLUS FROM INFUSION: 500.0000 mL | INTRAVENOUS | Status: DC

## 2013-05-02 MED ORDER — LIDOCAINE HCL (PF) 1 % IJ SOLN
30.0000 mL | INTRAMUSCULAR | Status: DC | PRN
  Filled 2013-05-02: qty 30

## 2013-05-02 MED ORDER — FLEET ENEMA 7-19 GM/118ML RE ENEM: 1.0000 | ENEMA | RECTAL | Status: DC | PRN

## 2013-05-02 MED ORDER — ACETAMINOPHEN 325 MG PO TABS: 650.0000 mg | ORAL_TABLET | ORAL | Status: DC | PRN

## 2013-05-02 MED ORDER — IBUPROFEN 600 MG PO TABS: 600.0000 mg | ORAL_TABLET | Freq: Four times a day (QID) | ORAL | Status: DC | PRN

## 2013-05-02 MED ORDER — ONDANSETRON HCL 4 MG/2ML IJ SOLN: 4.0000 mg | Freq: Four times a day (QID) | INTRAMUSCULAR | Status: DC | PRN

## 2013-05-03 ENCOUNTER — Encounter (HOSPITAL_COMMUNITY): Payer: Self-pay

## 2013-05-03 ENCOUNTER — Inpatient Hospital Stay (HOSPITAL_COMMUNITY): Payer: Medicaid Other | Admitting: Anesthesiology

## 2013-05-03 ENCOUNTER — Encounter (HOSPITAL_COMMUNITY): Payer: Medicaid Other | Admitting: Anesthesiology

## 2013-05-03 LAB — RPR

## 2013-05-03 MED ORDER — SODIUM BICARBONATE 8.4 % IV SOLN
INTRAVENOUS | Status: DC | PRN
  Administered 2013-05-03: 5 mL via EPIDURAL

## 2013-05-03 MED ORDER — DIPHENHYDRAMINE HCL 25 MG PO CAPS: 25.0000 mg | ORAL_CAPSULE | Freq: Four times a day (QID) | ORAL | Status: DC | PRN

## 2013-05-03 MED ORDER — ONDANSETRON HCL 4 MG/2ML IJ SOLN: 4.0000 mg | INTRAMUSCULAR | Status: DC | PRN

## 2013-05-03 MED ORDER — ZOLPIDEM TARTRATE 5 MG PO TABS: 5.0000 mg | ORAL_TABLET | Freq: Every evening | ORAL | Status: DC | PRN

## 2013-05-03 MED ORDER — PHENYLEPHRINE 40 MCG/ML (10ML) SYRINGE FOR IV PUSH (FOR BLOOD PRESSURE SUPPORT): 80.0000 ug | PREFILLED_SYRINGE | INTRAVENOUS | Status: DC | PRN

## 2013-05-03 MED ORDER — OXYCODONE-ACETAMINOPHEN 5-325 MG PO TABS
1.0000 | ORAL_TABLET | ORAL | Status: DC | PRN
  Administered 2013-05-04 (×2): 1 via ORAL
  Filled 2013-05-03 (×2): qty 1

## 2013-05-03 MED ORDER — EPHEDRINE 5 MG/ML INJ
INTRAVENOUS | Status: AC
  Filled 2013-05-03: qty 4

## 2013-05-03 MED ORDER — LACTATED RINGERS IV SOLN: 500.0000 mL | Freq: Once | INTRAVENOUS | Status: DC

## 2013-05-03 MED ORDER — PROMETHAZINE HCL 25 MG/ML IJ SOLN
12.5000 mg | Freq: Four times a day (QID) | INTRAMUSCULAR | Status: DC | PRN
  Administered 2013-05-03: 12:00:00 via INTRAVENOUS
  Filled 2013-05-03: qty 1

## 2013-05-03 MED ORDER — SENNOSIDES-DOCUSATE SODIUM 8.6-50 MG PO TABS
2.0000 | ORAL_TABLET | ORAL | Status: DC
  Administered 2013-05-04 (×2): 2 via ORAL
  Filled 2013-05-03 (×3): qty 2

## 2013-05-03 MED ORDER — BISACODYL 10 MG RE SUPP
10.0000 mg | Freq: Every day | RECTAL | Status: DC | PRN
  Administered 2013-05-04: 10 mg via RECTAL
  Filled 2013-05-03: qty 1

## 2013-05-03 MED ORDER — TETANUS-DIPHTH-ACELL PERTUSSIS 5-2.5-18.5 LF-MCG/0.5 IM SUSP: 0.5000 mL | Freq: Once | INTRAMUSCULAR | Status: DC

## 2013-05-03 MED ORDER — FENTANYL 2.5 MCG/ML BUPIVACAINE 1/10 % EPIDURAL INFUSION (WH - ANES)
14.0000 mL/h | INTRAMUSCULAR | Status: DC | PRN
  Administered 2013-05-03: 14 mL/h via EPIDURAL

## 2013-05-03 MED ORDER — PRENATAL MULTIVITAMIN CH
1.0000 | ORAL_TABLET | Freq: Every day | ORAL | Status: DC
  Administered 2013-05-04: 1 via ORAL
  Filled 2013-05-03: qty 1

## 2013-05-03 MED ORDER — FENTANYL 2.5 MCG/ML BUPIVACAINE 1/10 % EPIDURAL INFUSION (WH - ANES)
INTRAMUSCULAR | Status: DC
Start: 2013-05-03 — End: 2013-05-03
  Filled 2013-05-03: qty 125

## 2013-05-03 MED ORDER — WITCH HAZEL-GLYCERIN EX PADS
1.0000 "application " | MEDICATED_PAD | CUTANEOUS | Status: DC | PRN
  Administered 2013-05-04: 1 via TOPICAL

## 2013-05-03 MED ORDER — DIPHENHYDRAMINE HCL 50 MG/ML IJ SOLN: 12.5000 mg | INTRAMUSCULAR | Status: DC | PRN

## 2013-05-03 MED ORDER — SIMETHICONE 80 MG PO CHEW
80.0000 mg | CHEWABLE_TABLET | ORAL | Status: DC | PRN
Start: 2013-05-03 — End: 2013-05-05

## 2013-05-03 MED ORDER — OXYTOCIN 40 UNITS IN LACTATED RINGERS INFUSION - SIMPLE MED
1.0000 m[IU]/min | INTRAVENOUS | Status: DC
  Administered 2013-05-03: 6 m[IU]/min via INTRAVENOUS
  Administered 2013-05-03: 4 m[IU]/min via INTRAVENOUS
  Administered 2013-05-03: 2 m[IU]/min via INTRAVENOUS
  Filled 2013-05-03: qty 1000

## 2013-05-03 MED ORDER — DIBUCAINE 1 % RE OINT
1.0000 "application " | TOPICAL_OINTMENT | RECTAL | Status: DC | PRN
  Administered 2013-05-04: 1 via RECTAL
  Filled 2013-05-03: qty 28

## 2013-05-03 MED ORDER — EPHEDRINE 5 MG/ML INJ: 10.0000 mg | INTRAVENOUS | Status: DC | PRN

## 2013-05-03 MED ORDER — BENZOCAINE-MENTHOL 20-0.5 % EX AERO
1.0000 "application " | INHALATION_SPRAY | CUTANEOUS | Status: DC | PRN
  Administered 2013-05-04: 1 via TOPICAL
  Filled 2013-05-03: qty 56

## 2013-05-03 MED ORDER — FLEET ENEMA 7-19 GM/118ML RE ENEM: 1.0000 | ENEMA | Freq: Every day | RECTAL | Status: DC | PRN

## 2013-05-03 MED ORDER — IBUPROFEN 600 MG PO TABS
600.0000 mg | ORAL_TABLET | Freq: Four times a day (QID) | ORAL | Status: DC
  Administered 2013-05-03 – 2013-05-05 (×6): 600 mg via ORAL
  Filled 2013-05-03 (×6): qty 1

## 2013-05-03 MED ORDER — SERTRALINE HCL 25 MG PO TABS
25.0000 mg | ORAL_TABLET | Freq: Every day | ORAL | Status: DC | PRN
  Filled 2013-05-03: qty 1

## 2013-05-03 MED ORDER — FENTANYL 2.5 MCG/ML BUPIVACAINE 1/10 % EPIDURAL INFUSION (WH - ANES)
INTRAMUSCULAR | Status: DC | PRN
  Administered 2013-05-03: 14 mL/h via EPIDURAL

## 2013-05-03 MED ORDER — ONDANSETRON HCL 4 MG PO TABS: 4.0000 mg | ORAL_TABLET | ORAL | Status: DC | PRN

## 2013-05-03 MED ORDER — BUTORPHANOL TARTRATE 1 MG/ML IJ SOLN
1.0000 mg | Freq: Once | INTRAMUSCULAR | Status: AC
Start: 2013-05-03 — End: 2013-05-03
  Administered 2013-05-03: 1 mg via INTRAVENOUS
  Filled 2013-05-03: qty 1

## 2013-05-03 MED ORDER — PHENYLEPHRINE 40 MCG/ML (10ML) SYRINGE FOR IV PUSH (FOR BLOOD PRESSURE SUPPORT)
PREFILLED_SYRINGE | INTRAVENOUS | Status: AC
  Filled 2013-05-03: qty 10

## 2013-05-03 MED ORDER — LANOLIN HYDROUS EX OINT: TOPICAL_OINTMENT | CUTANEOUS | Status: DC | PRN

## 2013-05-03 MED ORDER — TERBUTALINE SULFATE 1 MG/ML IJ SOLN: 0.2500 mg | Freq: Once | INTRAMUSCULAR | Status: DC | PRN

## 2013-05-03 NOTE — H&P (Addendum)
Angel NuttingJoni Carpenter is a 28 y.o. female presenting at 39.3 for induction do to elevated blood pressures late in pregnancy.  GBS negative.  Patient's first child with cystic fibrosis. Maternal Medical History:  Fetal activity: Perceived fetal activity is normal.    Prenatal complications: Elevation of map for past 3 weeks  Prenatal Complications - Diabetes: none.    OB History   Grav Para Term Preterm Abortions TAB SAB Ect Mult Living   2 1 1  0 0 0 0 0 0 1     History reviewed. No pertinent past medical history. Past Surgical History  Procedure Laterality Date  . Inner ear surgery      Left  . No past surgeries     Family History: family history includes Diabetes in her father. Social History:  reports that she has been smoking.  She does not have any smokeless tobacco history on file. She reports that she drinks alcohol. She reports that she does not use illicit drugs.   Prenatal Transfer Tool  Maternal Diabetes: No Genetic Screening: Normal Maternal Ultrasounds/Referrals: Normal Fetal Ultrasounds or other Referrals:  None Maternal Substance Abuse:  No Significant Maternal Medications:  None Significant Maternal Lab Results:  None Other Comments:  Patients first child with cystic fibrosis  ROS  Dilation: 1.5 Effacement (%): 50 Station: -1 Exam by:: K Fraley RN Blood pressure 96/50, pulse 85, temperature 98.2 F (36.8 C), temperature source Axillary, resp. rate 18, height 5\' 8"  (1.727 m), weight 90.266 kg (199 lb), last menstrual period 07/31/2012. Maternal Exam:  Uterine Assessment: Contraction strength is mild.  Contraction frequency is regular.   Abdomen: Patient reports no abdominal tenderness. Fundal height is c/w dates.   Estimated fetal weight is 7.   Fetal presentation: vertex  Introitus: Amniotic fluid character: clear.  Pelvis: adequate for delivery.   Cervix: 4 cm and 50% effaced vertex at -1  Fetal Exam Fetal State Assessment: Category I - tracings are  normal.     Physical Exam  Prenatal labs: ABO, Rh: --/--/O POS, O POS (02/02 1946) Antibody: NEG (02/02 1946) Rubella: Immune (09/03 0000) RPR: NON REAC (04/09 2045)  HBsAg: Negative (09/03 0000)  HIV: Non-reactive (09/03 0000)  GBS: Negative (03/17 0000)   Assessment/Plan: Intrauterine pregnancy at term with gestational hypertension induction   Angel Carpenter 05/03/2013, 8:08 AM

## 2013-05-03 NOTE — Anesthesia Procedure Notes (Signed)

## 2013-05-03 NOTE — Anesthesia Preprocedure Evaluation (Signed)

## 2013-05-03 NOTE — Progress Notes (Signed)
efm removed per anesthesia    

## 2013-05-04 ENCOUNTER — Inpatient Hospital Stay (HOSPITAL_COMMUNITY): Admission: RE | Admit: 2013-05-04 | Payer: 59 | Source: Ambulatory Visit

## 2013-05-04 LAB — CBC
HEMATOCRIT: 32.6 % — AB (ref 36.0–46.0)
Hemoglobin: 11.1 g/dL — ABNORMAL LOW (ref 12.0–15.0)
MCH: 31.9 pg (ref 26.0–34.0)
MCHC: 34 g/dL (ref 30.0–36.0)
MCV: 93.7 fL (ref 78.0–100.0)
Platelets: 171 10*3/uL (ref 150–400)
RBC: 3.48 MIL/uL — ABNORMAL LOW (ref 3.87–5.11)
RDW: 13.4 % (ref 11.5–15.5)
WBC: 13 10*3/uL — AB (ref 4.0–10.5)

## 2013-05-04 MED ORDER — FLEET ENEMA 7-19 GM/118ML RE ENEM
1.0000 | ENEMA | Freq: Once | RECTAL | Status: AC
  Administered 2013-05-04: 1 via RECTAL

## 2013-05-04 NOTE — Anesthesia Postprocedure Evaluation (Signed)
Anesthesia Post Note  Patient: Angel Carpenter  Procedure(s) Performed: * No procedures listed *  Anesthesia type: Epidural  Patient location: Mother/Baby  Post pain: Pain level controlled  Post assessment: Post-op Vital signs reviewed  Last Vitals:  Filed Vitals:   05/04/13 0841  BP: 124/84  Pulse: 78  Temp: 36.7 C  Resp: 18    Post vital signs: Reviewed  Level of consciousness:alert  Complications: No apparent anesthesia complications

## 2013-05-04 NOTE — Progress Notes (Signed)
Post Partum Day one Subjective: no complaints  Objective: Blood pressure 124/84, pulse 78, temperature 98.1 F (36.7 C), temperature source Oral, resp. rate 18, height $RemoveBefo [id number]$5\' 8"ual period 07/31/2012, unknown if currently breastfeeding.  Physical Exam:  General: alert Lochia: appropriate Uterine Fundus: firm Incision: na DVT Evaluation: No evidence of DVT seen on physical exam.   Recent Labs  05/02/13 2045 05/04/13 0603  HGB 11.4* 11.1*  HCT 32.3* 32.6*    Assessment/Plan: Plan for discharge tomorrow   LOS: 2 days   Angel Carpenter 05/04/2013, 9:09 AM

## 2013-05-05 NOTE — Discharge Summary (Signed)
Obstetric Discharge Summary Reason for Admission: induction of labor Prenatal Procedures: gestational hypertension Intrapartum Procedures: spontaneous vaginal delivery Postpartum Procedures: none Complications-Operative and Postpartum: none Hemoglobin  Date Value Ref Range Status  05/04/2013 11.1* 12.0 - 15.0 g/dL Final     HCT  Date Value Ref Range Status  05/04/2013 32.6* 36.0 - 46.0 % Final    Physical Exam:  General: alert Lochia: appropriate Uterine Fundus: firm Incision: na DVT Evaluation: No evidence of DVT seen on physical exam.  Discharge Diagnoses: Term Pregnancy-delivered  Discharge Information: Date: 05/05/2013 Activity: pelvic rest Diet: routine Medications: PNV and Ibuprofen Condition: stable Instructions: refer to practice specific booklet Discharge to: home   Newborn Data: Live born female  Birth Weight: 6 lb 8.1 oz (2950 g) APGAR: 9, 9  Home with mother.  Raphael GibneyJohn S Alyxis Grippi 05/05/2013, 7:44 AM

## 2013-10-18 ENCOUNTER — Emergency Department (HOSPITAL_COMMUNITY)
Admission: EM | Admit: 2013-10-18 | Discharge: 2013-10-18 | Disposition: A | Discharge status: Home / Self Care | Payer: 59 | Attending: Emergency Medicine | Admitting: Emergency Medicine

## 2013-10-18 ENCOUNTER — Emergency Department (HOSPITAL_COMMUNITY): Payer: 59

## 2013-10-18 ENCOUNTER — Encounter (HOSPITAL_COMMUNITY): Payer: Self-pay | Admitting: Emergency Medicine

## 2013-10-18 DIAGNOSIS — F419 Anxiety disorder, unspecified: Secondary | ICD-10-CM

## 2013-10-18 DIAGNOSIS — Z3202 Encounter for pregnancy test, result negative: Secondary | ICD-10-CM | POA: Insufficient documentation

## 2013-10-18 DIAGNOSIS — F17200 Nicotine dependence, unspecified, uncomplicated: Secondary | ICD-10-CM | POA: Diagnosis not present

## 2013-10-18 DIAGNOSIS — R079 Chest pain, unspecified: Secondary | ICD-10-CM | POA: Insufficient documentation

## 2013-10-18 DIAGNOSIS — F411 Generalized anxiety disorder: Secondary | ICD-10-CM | POA: Diagnosis present

## 2013-10-18 LAB — CBC WITH DIFFERENTIAL/PLATELET
Basophils Absolute: 0 10*3/uL (ref 0.0–0.1)
Basophils Relative: 0 % (ref 0–1)
Eosinophils Absolute: 0.2 10*3/uL (ref 0.0–0.7)
Eosinophils Relative: 3 % (ref 0–5)
HCT: 37.1 % (ref 36.0–46.0)
Hemoglobin: 13.1 g/dL (ref 12.0–15.0)
Lymphocytes Relative: 30 % (ref 12–46)
Lymphs Abs: 2.2 10*3/uL (ref 0.7–4.0)
MCH: 31.5 pg (ref 26.0–34.0)
MCHC: 35.3 g/dL (ref 30.0–36.0)
MCV: 89.2 fL (ref 78.0–100.0)
Monocytes Absolute: 0.4 10*3/uL (ref 0.1–1.0)
Monocytes Relative: 6 % (ref 3–12)
Neutro Abs: 4.5 10*3/uL (ref 1.7–7.7)
Neutrophils Relative %: 61 % (ref 43–77)
Platelets: 235 10*3/uL (ref 150–400)
RBC: 4.16 MIL/uL (ref 3.87–5.11)
RDW: 12.3 % (ref 11.5–15.5)
WBC: 7.3 10*3/uL (ref 4.0–10.5)

## 2013-10-18 LAB — BASIC METABOLIC PANEL
Anion gap: 11 (ref 5–15)
BUN: 14 mg/dL (ref 6–23)
CO2: 26 mEq/L (ref 19–32)
Calcium: 9.2 mg/dL (ref 8.4–10.5)
Chloride: 104 mEq/L (ref 96–112)
Creatinine, Ser: 0.78 mg/dL (ref 0.50–1.10)
GFR calc Af Amer: >90 mL/min (ref >90)
GFR calc non Af Amer: >90 mL/min (ref >90)
Glucose, Bld: 100 mg/dL — ABNORMAL HIGH (ref 70–99)
Potassium: 3.7 mEq/L (ref 3.7–5.3)
Sodium: 141 mEq/L (ref 137–147)

## 2013-10-18 LAB — HCG, SERUM, QUALITATIVE: Preg, Serum: NEGATIVE

## 2013-10-18 MED ORDER — TRAMADOL HCL 50 MG PO TABS
50.0000 mg | ORAL_TABLET | Freq: Once | ORAL | Status: AC
  Administered 2013-10-18: 50 mg via ORAL
  Filled 2013-10-18: qty 1

## 2013-10-18 MED ORDER — HYDROXYZINE HCL 25 MG PO TABS
50.0000 mg | ORAL_TABLET | Freq: Once | ORAL | Status: AC
  Administered 2013-10-18: 50 mg via ORAL
  Filled 2013-10-18: qty 2

## 2013-10-18 MED ORDER — HYDROXYZINE HCL 25 MG PO TABS: 25.0000 mg | ORAL_TABLET | Freq: Four times a day (QID) | ORAL | Status: DC
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
## 2013-10-18 NOTE — Discharge Instructions (Signed)
Do not hesitate to return to the emergency room for any new, worsening or concerning symptoms. ° °Please obtain primary care using resource guide below. But the minute you were seen in the emergency room and that they will need to obtain records for further outpatient management. ° ° ° °Emergency Department Resource Guide °1) Find a Doctor and Pay Out of Pocket °Although you won't have to find out who is covered by your insurance plan, it is a good idea to ask around and get recommendations. You will then need to call the office and see if the doctor you have chosen will accept you as a new patient and what types of options they offer for patients who are self-pay. Some doctors offer discounts or will set up payment plans for their patients who do not have insurance, but you will need to ask so you aren't surprised when you get to your appointment. ° °2) Contact Your Local Health Department °Not all health departments have doctors that can see patients for sick visits, but many do, so it is worth a call to see if yours does. If you don't know where your local health department is, you can check in your phone book. The CDC also has a tool to help you locate your state's health department, and many state websites also have listings of all of their local health departments. ° °3) Find a Walk-in Clinic °If your illness is not likely to be very severe or complicated, you may want to try a walk in clinic. These are popping up all over the country in pharmacies, drugstores, and shopping centers. They're usually staffed by nurse practitioners or physician assistants that have been trained to treat common illnesses and complaints. They're usually fairly quick and inexpensive. However, if you have serious medical issues or chronic medical problems, these are probably not your best option. ° °No Primary Care Doctor: °- Call [facility] at  832-8000 - they can help you locate a primary care doctor that  accepts your  insurance, provides certain services, etc. °- Physician Referral Service- 1-800-533-3463 ° °Chronic Pain Problems: °Organization         Address  Phone   Notes  °[facility]  (336) 297-2271 Patients need to be referred by their primary care doctor.  ° °Medication Assistance: °Organization         Address  Phone   Notes  °[place] [facility] [address] °[place], [place] [address] (336) 641-8030 --Must be a resident of [place] °-- Must have NO insurance coverage whatsoever (no Medicaid/ Medicare, etc.) °-- The pt. MUST have a primary care doctor that directs their care regularly and follows them in the community °  °[facility]  (866) 331-1348   °[employer]  (888) 892-1162   ° °Agencies that provide inexpensive medical care: °Organization         Address  Phone   Notes  °[facility]  (336) 832-8035   °[facility]    (336) 832-7272   °[facility] [address] °[place], [place] [address] (336) 832-4777   °[facility] [place] [facility] [address], °[place] (336) 271-4999   °[facility]    (336) 373-0678   °[facility]    (336) 272-1050   °[facility] ° [address], [place] Phone:  (336) 832-4444, Fax:  (336) 832-4440 Hours of Operation:  9 am -   6 pm, M-F.  Also accepts Medicaid/Medicare and self-pay.  °[facility] ° [address], [place] Phone: (336) 832-3150, Fax: (336) 832-3151. Hours of Operation:  8:30 am - 5:30 pm, M-F.  Also accepts Medicaid and self-pay.  °[facility] [place] [address], [place] Phone: (336) 878-6027   °[facility] [address], [place], [place] (336)723-1848, Ext. 123 Mondays & Thursdays: 7-9 AM.  First 15 patients are seen on a first come, first serve basis. °  ° °Medicaid-accepting [place] Providers: ° °Organization          Address  Phone   Notes  °[facility] [address], [place] (336) 641-2100 Also accepts self-pay patients.  °[facility] [address], [place] ° (336) 856-9996   °[facility] [address], [place] (336) 288-8857   °[facility] [address] [place] [address], [place] (336) 299-7000   °Veita Bland [address], [place]  ° (336) 373-1557 Only accepts [place] Access Medicaid patients after they have their name applied to their card.  ° °Self-Pay (no insurance) in [place]: ° °Organization         Address  Phone   Notes  °Sickle Cell Patients, [facility] [address], [place] (336) 832-1970   °[facility] [address], [place] (336) 832-4400   °[facility] [place] [facility] ° [address], [place] (336) 992-4800   °[facility]/Dr. Osei-Bonsu ° [address] [place] [address], [place] or [address], [place] (336) 841-8500 Phone number for both [place] and [place] locations is the same.  °[facility] [address], [place] (336) 299-0000   °[facility] [address] [place] [address], [place] or [address] (336) 852-7530 °(336) 878-2260   °[facility] [address], [place] (336) 350-1642, phone; (336) 294-5005, fax Sees patients 1st and 3rd Saturday of every month.  Must not qualify for public or private insurance (i.e. Medicaid, Medicare, [place] Health Choice, Veterans' Benefits) • Household income should be no more than 200% of the poverty level •The clinic cannot treat you if you are pregnant or think you are pregnant • Sexually transmitted diseases are not treated at the clinic.  ° ° °Dental Care: °Organization         Address  Phone  Notes  °[facility] [address],  [place] (336) 641-6152 Accepts children up to age 21 who are enrolled in Medicaid or [place] Health Choice; pregnant women with a Medicaid card; and children who have applied for Medicaid or [place] Health Choice, but were declined, whose parents can pay a reduced fee at time of service.  °[facility] [place]  [address], [place] (336) 641-7733 Accepts children up to age 21 who are enrolled in Medicaid or [place] Health Choice; pregnant women with a Medicaid card; and children who have applied for Medicaid or [place] Health Choice, but were declined, whose parents can pay a reduced fee at time of service.  °[facility] ° [address],  (336) 641-4533 Patients are seen by appointment only. Walk-ins are not accepted. [facility] will see patients 18 years of age and   older. °Monday - Tuesday (8am-5pm) °Most Wednesdays (8:30-5pm) °$30 per visit, cash only  °[facility] ° [address], [place] (336) 641-4533 Patients are seen by appointment only. Walk-ins are not accepted. [facility] will see patients 18 years of age and older. °One Wednesday Evening (Monthly: Volunteer Based).  $30 per visit, cash only  °[facility]  (919) 537-3737 for adults; Children under age 4, call Graduate Pediatric Dentistry at (919) [phone]. Children aged 4-14, please call (919) 537-3737 to request a pediatric application. ° Dental services are provided in all areas of dental care including fillings, crowns and bridges, complete and partial dentures, implants, gum treatment, root canals, and extractions. Preventive care is also provided. Treatment is provided to both adults and children. °Patients are selected via a lottery and there is often a waiting list. °  °[facility] [address], °[place] ° (336) 763-8833 [email or web address] °  °[facility] [address], [place], [place]  (336)723-1848, Ext. 123 Second and Fourth Thursday of each month, opens at 6:30 AM; Clinic ends at 9 AM.  Patients are seen on a first-come first-served basis, and a limited number are seen during each clinic.  ° °[facility] ° [address], [place], [place] (336) 723-7904   Eligibility Requirements °You must have lived in [place], [place], or [place] counties for at least the last three months. °  You cannot be eligible for state or federal sponsored healthcare insurance, including Veterans Administration, Medicaid, or Medicare. °  You generally cannot be eligible for healthcare insurance through your employer.  °  How to apply: °Eligibility screenings are held every Tuesday and Wednesday afternoon from 1:00 pm until 4:00 pm. You do not need an appointment for the interview!  °[facility] [address], [place], [place] 336-631-2330   °[facility]  336-342-8273   °[facility]  336-703-3100   °[facility]  336-570-6415   ° °Behavioral Health Resources in the Community: °Intensive Outpatient Programs °Organization         Address  Phone  Notes  °[facility] [address], [place], [place] 336-878-6098   °[facility] [address], [place], [place] 336-832-9800   °[facility] [address], [place], [place] ° 336-882-2125   °[facility] [address],  °[place], [place] 1-800-853-5163 or 336-641-4981   °Substance Abuse Resources °Organization         Address  Phone  Notes  °[facility]  336-882-2125   °[facility]  336-784-9470   °[facility]  336-285-9073   °[facility]  336-845-3988   °Residential & Outpatient Substance Abuse Program  1-800-659-3381   °Psychological Services °Organization         Address  Phone  Notes  °[facility]  336- 832-9600   °[facility]  336- 378-7881    °[facility] [address], [place] 1-800-853-5163 or 336-641-4981   ° °Mobile Crisis Teams °Organization         Address  Phone  Notes  °[facility], Mobile Crisis Care Unit  1-877-626-1772   °Assertive °[facility] ° [address] [place], [place] 336-834-9664   °Sharon DeEsch [address] °[place] 336-554-5454   ° °Self-Help/Support Groups °Organization         Address    Phone             Notes  °[facility] [place] - variety of support groups  336- 373-1402 Call for more information  °Narcotics Anonymous (NA), [facility] [address], °[place]  2 meetings at this location  ° °Residential Treatment Programs °Organization         Address  Phone  Notes  °[facility] [address],    °[place]  1-866-801-8205   °[facility] ° [name], [place], [place] 704-293-8524   °[facility] [address], [place] 336-845-3988 Admissions: 8am-3pm M-F  °[facility] [address]    °[place], [place] 336-841-1104   °[facility] [address], [place], [place] 336-379-7146   °[facility] [address]  °[place], [place] 336-285-9073   °[facility] Intensive Outpatient [address], [place], [place] 336-852-3033   °[facility] [address]  °[place], [place] 1-877-615-2722 or 336-784-9470   °[facility] [address] [place], [place] 336-227-7417 Accepts Medicaid  °[facility] [address]  °[place] 1-800-659-3381 Substance Abuse/Addiction Treatment  ° °[place] Behavioral Health Resources °Organization         Address  Phone  Notes  °[facility]  (888) 581-9988   °Julie Brannon, PhD [address] [place], [place]   (336) 349-5553 or (336) 951-0000   °[facility]   [address] °[place], [place] (336) 349-4454   °[facility] [address], [place], [place] (336) 342-8316 Insurance/Medicaid/sponsorship through [facility]  °[facility] [address]                                    [place], [place] (336) 342-8316 Therapy/tele-psych/case  °[facility] [address]  ° [place], [place] (336) 349-2233    °Dr. Arfeen  (336) 349-4544   °[facility] [place]  [employer] [place] [facility] 1) [address], [place] °2) [address], [place] °3)  [address], [place] (336) 349-3220 °(336) 342-7768 ° °(336) 342-8140   °[place] [facility] (336) 342-1394 or (336) 342-3537 (After Hours)    ° ° ° °
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
## 2013-10-18 NOTE — ED Notes (Signed)
SW called and states that she has already left for the day. SW states that since pt reports having strong support system that will help her in leaving her spouse. SW gave number of [facility] [place] to give to pt when she needs assistance. Information was given to pt

## 2013-10-20 NOTE — ED Provider Notes (Signed)
History/physical exam/procedure(s) were performed by non-physician practitioner and as supervising physician I was immediately available for consultation/collaboration. I have reviewed all notes and am in agreement with care and plan.   Hilario Quarry, MD 10/20/13 2330

## 2013-11-25 ENCOUNTER — Encounter (HOSPITAL_COMMUNITY): Payer: Self-pay | Admitting: Emergency Medicine

## 2014-03-09 ENCOUNTER — Emergency Department (HOSPITAL_COMMUNITY)
Admission: EM | Admit: 2014-03-09 | Discharge: 2014-03-09 | Disposition: A | Discharge status: Home / Self Care | Payer: No Typology Code available for payment source | Attending: Emergency Medicine | Admitting: Emergency Medicine

## 2014-03-09 ENCOUNTER — Encounter (HOSPITAL_COMMUNITY): Payer: Self-pay | Admitting: Emergency Medicine

## 2014-03-09 DIAGNOSIS — S199XXA Unspecified injury of neck, initial encounter: Secondary | ICD-10-CM | POA: Insufficient documentation

## 2014-03-09 DIAGNOSIS — Z720 Tobacco use: Secondary | ICD-10-CM | POA: Insufficient documentation

## 2014-03-09 DIAGNOSIS — Y998 Other external cause status: Secondary | ICD-10-CM | POA: Diagnosis not present

## 2014-03-09 DIAGNOSIS — S0990XA Unspecified injury of head, initial encounter: Secondary | ICD-10-CM | POA: Insufficient documentation

## 2014-03-09 DIAGNOSIS — S3992XA Unspecified injury of lower back, initial encounter: Secondary | ICD-10-CM | POA: Diagnosis present

## 2014-03-09 DIAGNOSIS — S39012A Strain of muscle, fascia and tendon of lower back, initial encounter: Secondary | ICD-10-CM | POA: Diagnosis not present

## 2014-03-09 DIAGNOSIS — Y92410 Unspecified street and highway as the place of occurrence of the external cause: Secondary | ICD-10-CM | POA: Diagnosis not present

## 2014-03-09 DIAGNOSIS — Y9389 Activity, other specified: Secondary | ICD-10-CM | POA: Insufficient documentation

## 2014-03-09 DIAGNOSIS — T148XXA Other injury of unspecified body region, initial encounter: Secondary | ICD-10-CM

## 2014-03-09 MED ORDER — CYCLOBENZAPRINE HCL 10 MG PO TABS: 10.0000 mg | ORAL_TABLET | Freq: Two times a day (BID) | ORAL | Status: DC | PRN

## 2014-03-09 MED ORDER — IBUPROFEN 800 MG PO TABS: 800.0000 mg | ORAL_TABLET | Freq: Three times a day (TID) | ORAL | Status: DC
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
## 2017-01-18 ENCOUNTER — Emergency Department (HOSPITAL_BASED_OUTPATIENT_CLINIC_OR_DEPARTMENT_OTHER): Payer: Self-pay

## 2017-01-18 ENCOUNTER — Emergency Department (HOSPITAL_BASED_OUTPATIENT_CLINIC_OR_DEPARTMENT_OTHER)
Admission: EM | Admit: 2017-01-18 | Discharge: 2017-01-18 | Disposition: A | Discharge status: Home / Self Care | Payer: Self-pay | Attending: Emergency Medicine | Admitting: Emergency Medicine

## 2017-01-18 ENCOUNTER — Other Ambulatory Visit: Payer: Self-pay

## 2017-01-18 ENCOUNTER — Encounter (HOSPITAL_BASED_OUTPATIENT_CLINIC_OR_DEPARTMENT_OTHER): Payer: Self-pay | Admitting: *Deleted

## 2017-01-18 DIAGNOSIS — J40 Bronchitis, not specified as acute or chronic: Secondary | ICD-10-CM

## 2017-01-18 DIAGNOSIS — Z87891 Personal history of nicotine dependence: Secondary | ICD-10-CM | POA: Insufficient documentation

## 2017-01-18 DIAGNOSIS — R042 Hemoptysis: Secondary | ICD-10-CM | POA: Insufficient documentation

## 2017-01-18 DIAGNOSIS — J209 Acute bronchitis, unspecified: Secondary | ICD-10-CM | POA: Insufficient documentation

## 2017-01-18 LAB — CBC WITH DIFFERENTIAL/PLATELET
Basophils Absolute: 0 10*3/uL (ref 0.0–0.1)
Basophils Relative: 0 %
Eosinophils Absolute: 0.3 10*3/uL (ref 0.0–0.7)
Eosinophils Relative: 2 %
HCT: 37 % (ref 36.0–46.0)
Hemoglobin: 13 g/dL (ref 12.0–15.0)
Lymphocytes Relative: 16 %
Lymphs Abs: 2 10*3/uL (ref 0.7–4.0)
MCH: 31.6 pg (ref 26.0–34.0)
MCHC: 35.1 g/dL (ref 30.0–36.0)
MCV: 90 fL (ref 78.0–100.0)
Monocytes Absolute: 0.6 10*3/uL (ref 0.1–1.0)
Monocytes Relative: 5 %
Neutro Abs: 9.5 10*3/uL — ABNORMAL HIGH (ref 1.7–7.7)
Neutrophils Relative %: 77 %
Platelets: 227 10*3/uL (ref 150–400)
RBC: 4.11 MIL/uL (ref 3.87–5.11)
RDW: 12 % (ref 11.5–15.5)
WBC: 12.4 10*3/uL — ABNORMAL HIGH (ref 4.0–10.5)

## 2017-01-18 LAB — BASIC METABOLIC PANEL
Anion gap: 8 (ref 5–15)
BUN: 12 mg/dL (ref 6–20)
CO2: 25 mmol/L (ref 22–32)
Calcium: 9.2 mg/dL (ref 8.9–10.3)
Chloride: 104 mmol/L (ref 101–111)
Creatinine, Ser: 0.67 mg/dL (ref 0.44–1.00)
GFR calc Af Amer: >60 mL/min (ref >60)
GFR calc non Af Amer: >60 mL/min (ref >60)
Glucose, Bld: 99 mg/dL (ref 65–99)
Potassium: 3.9 mmol/L (ref 3.5–5.1)
Sodium: 137 mmol/L (ref 135–145)

## 2017-01-18 LAB — D-DIMER, QUANTITATIVE (NOT AT ARMC): D-Dimer, Quant: 0.46 ug/mL-FEU (ref 0.00–0.50)

## 2017-01-18 MED ORDER — DEXAMETHASONE 6 MG PO TABS
12.0000 mg | ORAL_TABLET | Freq: Once | ORAL | Status: AC
  Administered 2017-01-18: 12 mg via ORAL
  Filled 2017-01-18: qty 2

## 2017-01-18 NOTE — ED Notes (Signed)
ED Provider at bedside. 

## 2017-01-18 NOTE — ED Triage Notes (Signed)
Reports cough and cold symptoms for several days; reports 2 days ago she started coughing up blood intermittently. Reports chest discomfort when taking a deep breath; reports sob on exertion (breathing regular, even, unlabored during triage). Denies fever, n/v/d. Denies night sweats.

## 2017-01-18 NOTE — ED Provider Notes (Signed)
[facility] EMERGENCY DEPARTMENT Provider Note   CSN: [id number] Arrival date & time: 01/18/17  0911     History   Chief Complaint Chief Complaint  Patient presents with  . Hemoptysis    HPI Ludger NuttingJoni Wigle is a 31 y.o. female.  HPI  31 year old female with cough and "cold symptoms."  Onset 3-4 days ago.  She reports a small amount of blood mixed in with her sputum.  Chest discomfort when healing deeply.  No fevers.  No unusual leg pain or swelling.  Denies any recent surgeries, long trips or other immobilization.  No past history of DVT/PE.  No exogenous hormone usage. Her son has CF and she tried albuterol but says it only made her feel jittery.  History reviewed. No pertinent past medical history.  Patient Active Problem List   Diagnosis Date Noted  . Spontaneous vaginal delivery 05/03/2013    Class: Status post  . Normal labor and delivery 05/02/2013  . Fetus affected by placental abruption 02/25/2013    Class: Hospitalized for    Past Surgical History:  Procedure Laterality Date  . INNER EAR SURGERY     Left  . NO PAST SURGERIES      OB History    Gravida Para Term Preterm AB Living   2 2 2  0 0 2   SAB TAB Ectopic Multiple Live Births   0 0 0 0 1       Home Medications    Prior to Admission medications   Medication Sig Start Date End Date Taking? Authorizing Provider  cyclobenzaprine (FLEXERIL) 10 MG tablet Take 1 tablet (10 mg total) by mouth 2 (two) times daily as needed for muscle spasms. 03/09/14   Elpidio AnisUpstill, Shari, PA-C  hydrOXYzine (ATARAX/VISTARIL) 25 MG tablet Take 1 tablet (25 mg total) by mouth every 6 (six) hours. Patient not taking: Reported on 03/09/2014 10/18/13   Pisciotta, Bayley ReiningNicole, PA-C  ibuprofen (ADVIL,MOTRIN) 800 MG tablet Take 1 tablet (800 mg total) by mouth 3 (three) times daily. 03/09/14   Elpidio AnisUpstill, Shari, PA-C    Family History Family History  Problem Relation Age of Onset  . Diabetes Father     Social History Social  History   Tobacco Use  . Smoking status: Former Games developermoker  . Smokeless tobacco: Never Used  Substance Use Topics  . Alcohol use: No    Frequency: Never  . Drug use: No     Allergies   Sulfa antibiotics   Review of Systems Review of Systems  All systems reviewed and negative, other than as noted in HPI.  Physical Exam Updated Vital Signs BP (!) 139/92 (BP Location: Left Arm)   Pulse 98   Temp 98.7 F (37.1 C) (Oral)   Resp 16   Ht 5\' 8"  (1.727 m)   Wt 82.6 kg (182 lb)   LMP 12/25/2016 (Exact Date)   SpO2 100%   BMI 27.67 kg/m   Physical Exam  Constitutional: She appears well-developed and well-nourished. No distress.  HENT:  Head: Normocephalic and atraumatic.  Mouth/Throat: No oropharyngeal exudate.  Eyes: Conjunctivae are normal. Right eye exhibits no discharge. Left eye exhibits no discharge.  Neck: Neck supple.  Cardiovascular: Normal rate, regular rhythm and normal heart sounds. Exam reveals no gallop and no friction rub.  No murmur heard. Pulmonary/Chest: Effort normal and breath sounds normal. No respiratory distress.  Abdominal: Soft. She exhibits no distension. There is no tenderness.  Musculoskeletal: She exhibits no edema or tenderness.  Lower extremities symmetric as  compared to each other. No calf tenderness. Negative Homan's. No palpable cords.   Neurological: She is alert.  Skin: Skin is warm and dry.  Psychiatric: She has a normal mood and affect. Her behavior is normal. Thought content normal.  Nursing note and vitals reviewed.    ED Treatments / Results  Labs (all labs ordered are listed, but only abnormal results are displayed) Labs Reviewed  CBC WITH DIFFERENTIAL/PLATELET - Abnormal; Notable for the following components:      Result Value   WBC 12.4 (*)    Neutro Abs 9.5 (*)    All other components within normal limits  BASIC METABOLIC PANEL  D-DIMER, QUANTITATIVE (NOT [facility])    EKG  EKG Interpretation None        Radiology Dg Chest 2 View  Result Date: 01/18/2017 CLINICAL DATA:  Cough and cold symptoms. EXAM: CHEST  2 VIEW COMPARISON:  12/21/2013. FINDINGS: Mediastinum and hilar structures normal. Lungs are clear. No pleural effusion or pneumothorax. Heart size normal. No acute bony abnormality . IMPRESSION: No acute cardiopulmonary disease. Electronically Signed   By: Maisie Fushomas  Register   On: 01/18/2017 09:49    Procedures Procedures (including critical care time)  Medications Ordered in ED Medications - No data to display   Initial Impression / Assessment and Plan / ED Course  I have reviewed the triage vital signs and the nursing notes.  Pertinent labs & imaging results that were available during my care of the patient were reviewed by me and considered in my medical decision making (see chart for details).     31 year old female with cough and scant hemoptysis.  Suspect viral URI.  Chest x-ray is clear.  D-dimer is normal.  Blood may be from airway irritation.  She sounds clear on exam.  She has no increased work of breathing.  Oxygen saturations are 100%.  I doubt emergent process.  Plan symptom medic treatment.  Return precautions discussed.  Final Clinical Impressions(s) / ED Diagnoses   Final diagnoses:  Bronchitis  Hemoptysis    ED Discharge Orders    None       Raeford RazorKohut, Eben Choinski, MD 01/23/17 1223

## 2017-08-27 ENCOUNTER — Other Ambulatory Visit: Payer: Self-pay

## 2017-08-27 ENCOUNTER — Emergency Department (HOSPITAL_BASED_OUTPATIENT_CLINIC_OR_DEPARTMENT_OTHER)
Admission: EM | Admit: 2017-08-27 | Discharge: 2017-08-27 | Disposition: A | Discharge status: Home / Self Care | Payer: Self-pay | Attending: Emergency Medicine | Admitting: Emergency Medicine

## 2017-08-27 ENCOUNTER — Encounter (HOSPITAL_BASED_OUTPATIENT_CLINIC_OR_DEPARTMENT_OTHER): Payer: Self-pay | Admitting: Emergency Medicine

## 2017-08-27 DIAGNOSIS — Z87891 Personal history of nicotine dependence: Secondary | ICD-10-CM | POA: Insufficient documentation

## 2017-08-27 DIAGNOSIS — J029 Acute pharyngitis, unspecified: Secondary | ICD-10-CM | POA: Insufficient documentation

## 2017-08-27 DIAGNOSIS — J020 Streptococcal pharyngitis: Secondary | ICD-10-CM

## 2017-08-27 LAB — GROUP A STREP BY PCR: Group A Strep by PCR: NOT DETECTED

## 2017-08-27 NOTE — ED Provider Notes (Signed)
[facility] EMERGENCY DEPARTMENT Provider Note   CSN: [id number] Arrival date & time: 08/27/17  1543     History   Chief Complaint Chief Complaint  Patient presents with  . Sore Throat    HPI Angel Carpenter is a 32 y.o. female presents for evaluation of sore throat that began yesterday.  Patient states that her throat became progressive more sore over the last 24 hours prompting ED visit.  She states that she has been able to swallow her secretions and p.o. but does report worsening pain and swelling.  She took 1 dose of ibuprofen with minimal symptomatic relief.  Patient states that she has not had any fevers.  She has had some slight nasal congestion.  Patient denies any cough.  The history is provided by the patient.    History reviewed. No pertinent past medical history.  Patient Active Problem List   Diagnosis Date Noted  . Spontaneous vaginal delivery 05/03/2013    Class: Status post  . Normal labor and delivery 05/02/2013  . Fetus affected by placental abruption 02/25/2013    Class: Hospitalized for    Past Surgical History:  Procedure Laterality Date  . INNER EAR SURGERY     Left  . NO PAST SURGERIES       OB History    Gravida  2   Para  2   Term  2   Preterm  0   AB  0   Living  2     SAB  0   TAB  0   Ectopic  0   Multiple  0   Live Births  1            Home Medications    Prior to Admission medications   Medication Sig Start Date End Date Taking? Authorizing Provider  cyclobenzaprine (FLEXERIL) 10 MG tablet Take 1 tablet (10 mg total) by mouth 2 (two) times daily as needed for muscle spasms. 03/09/14   Elpidio AnisUpstill, Shari, PA-C  hydrOXYzine (ATARAX/VISTARIL) 25 MG tablet Take 1 tablet (25 mg total) by mouth every 6 (six) hours. Patient not taking: Reported on 03/09/2014 10/18/13   Pisciotta, Tamyah ReiningNicole, PA-C  ibuprofen (ADVIL,MOTRIN) 800 MG tablet Take 1 tablet (800 mg total) by mouth 3 (three) times daily. 03/09/14   Elpidio AnisUpstill, Shari,  PA-C    Family History Family History  Problem Relation Age of Onset  . Diabetes Father     Social History Social History   Tobacco Use  . Smoking status: Former Games developermoker  . Smokeless tobacco: Never Used  Substance Use Topics  . Alcohol use: No    Frequency: Never  . Drug use: No     Allergies   Sulfa antibiotics   Review of Systems Review of Systems  Constitutional: Negative for fever.  HENT: Positive for congestion and sore throat. Negative for drooling and trouble swallowing.   Respiratory: Negative for cough and shortness of breath.   Cardiovascular: Negative for chest pain.  Gastrointestinal: Negative for nausea and vomiting.  All other systems reviewed and are negative.    Physical Exam Updated Vital Signs BP (!) 136/92 (BP Location: Left Arm)   Pulse 95   Temp 98.5 F (36.9 C) (Oral)   Resp 18   Ht 5\' 8"  (1.727 m)   Wt 88 kg (194 lb)   LMP 08/21/2017   SpO2 100%   BMI 29.50 kg/m   Physical Exam  Constitutional: She appears well-developed and well-nourished.  HENT:  Head: Normocephalic  and atraumatic.  Nose: Mucosal edema present.  Mouth/Throat: Uvula is midline and mucous membranes are normal. No trismus in the jaw. Posterior oropharyngeal erythema present. No oropharyngeal exudate or posterior oropharyngeal edema.  Posterior oropharynx is erythematous.  No evidence of exudates, edema.  Uvula is midline.  No trismus.  Airways patent, phonation is normal.  Eyes: Conjunctivae and EOM are normal. Right eye exhibits no discharge. Left eye exhibits no discharge. No scleral icterus.  Pulmonary/Chest: Effort normal.  Neurological: She is alert.  Skin: Skin is warm and dry.  Psychiatric: She has a normal mood and affect. Her speech is normal and behavior is normal.  Nursing note and vitals reviewed.    ED Treatments / Results  Labs (all labs ordered are listed, but only abnormal results are displayed) Labs Reviewed  GROUP A STREP BY PCR     EKG None  Radiology No results found.  Procedures Procedures (including critical care time)  Medications Ordered in ED Medications - No data to display   Initial Impression / Assessment and Plan / ED Course  I have reviewed the triage vital signs and the nursing notes.  Pertinent labs & imaging results that were available during my care of the patient were reviewed by me and considered in my medical decision making (see chart for details).     32 year old female who presents for evaluation of sore throat that began yesterday.  No fever.  Does report nasal congestion.  Able to tolerate secretions and p.o. but does report some worsening pain. Patient is afebrile, non-toxic appearing, sitting comfortably on examination table. Vital signs reviewed and stable.  On exam, posterior oropharynx is slightly erythematous.  No edema, exudates.  Consider pharyngitis versus postnasal drip.  History/physical exam is not concerning for Ludwig angina or peritonsillar abscess.  Rapid strep ordered at triage.  Rapid strep reviewed.  Negative for strep.  Discussed results with patient.  Encouraged at home supportive care measures. Patient had ample opportunity for questions and discussion. All patient's questions were answered with full understanding.  Strict return precautions discussed. Patient expresses understanding and agreement to plan.   Final Clinical Impressions(s) / ED Diagnoses   Final diagnoses:  Strep throat  Sore throat    ED Discharge Orders    None       Rosana Hoes 08/27/17 1659    Charlynne Pander, MD 08/27/17 2350

## 2017-08-27 NOTE — ED Triage Notes (Signed)
Sore throat since yesterday.

## 2017-08-27 NOTE — Discharge Instructions (Signed)
You can take Tylenol or Ibuprofen as directed for pain. You can alternate Tylenol and Ibuprofen every 4 hours. If you take Tylenol at 1pm, then you can take Ibuprofen at 5pm. Then you can take Tylenol again at 9pm.   Make sure you are drinking plenty of fluids and stay hydrated.  Follow-up with your primary care doctor next 2 to 4 days for further evaluation.  Return to emergency department for any worsening pain, fever, difficulty breathing, difficulty swallowing your saliva or any other worsening or concerning symptoms.

## 2019-08-10 ENCOUNTER — Emergency Department (HOSPITAL_BASED_OUTPATIENT_CLINIC_OR_DEPARTMENT_OTHER): Payer: 59

## 2019-08-10 ENCOUNTER — Emergency Department (HOSPITAL_BASED_OUTPATIENT_CLINIC_OR_DEPARTMENT_OTHER)
Admission: EM | Admit: 2019-08-10 | Discharge: 2019-08-10 | Disposition: A | Discharge status: Home / Self Care | Payer: 59 | Attending: Emergency Medicine | Admitting: Emergency Medicine

## 2019-08-10 ENCOUNTER — Encounter (HOSPITAL_BASED_OUTPATIENT_CLINIC_OR_DEPARTMENT_OTHER): Payer: Self-pay | Admitting: Emergency Medicine

## 2019-08-10 ENCOUNTER — Other Ambulatory Visit: Payer: Self-pay

## 2019-08-10 DIAGNOSIS — I10 Essential (primary) hypertension: Secondary | ICD-10-CM | POA: Diagnosis not present

## 2019-08-10 DIAGNOSIS — M546 Pain in thoracic spine: Secondary | ICD-10-CM | POA: Diagnosis not present

## 2019-08-10 DIAGNOSIS — F17200 Nicotine dependence, unspecified, uncomplicated: Secondary | ICD-10-CM | POA: Diagnosis not present

## 2019-08-10 DIAGNOSIS — K769 Liver disease, unspecified: Secondary | ICD-10-CM

## 2019-08-10 DIAGNOSIS — R109 Unspecified abdominal pain: Secondary | ICD-10-CM | POA: Insufficient documentation

## 2019-08-10 DIAGNOSIS — R1084 Generalized abdominal pain: Secondary | ICD-10-CM

## 2019-08-10 HISTORY — DX: Essential (primary) hypertension: I10

## 2019-08-10 LAB — COMPREHENSIVE METABOLIC PANEL
ALT: 23 U/L (ref 0–44)
AST: 22 U/L (ref 15–41)
Albumin: 4.1 g/dL (ref 3.5–5.0)
Alkaline Phosphatase: 73 U/L (ref 38–126)
Anion gap: 8 (ref 5–15)
BUN: 13 mg/dL (ref 6–20)
CO2: 27 mmol/L (ref 22–32)
Calcium: 9.2 mg/dL (ref 8.9–10.3)
Chloride: 106 mmol/L (ref 98–111)
Creatinine, Ser: 0.78 mg/dL (ref 0.44–1.00)
GFR calc Af Amer: >60 mL/min (ref >60)
GFR calc non Af Amer: >60 mL/min (ref >60)
Glucose, Bld: 85 mg/dL (ref 70–99)
Potassium: 4.5 mmol/L (ref 3.5–5.1)
Sodium: 141 mmol/L (ref 135–145)
Total Bilirubin: 0.1 mg/dL — ABNORMAL LOW (ref 0.3–1.2)
Total Protein: 7.6 g/dL (ref 6.5–8.1)

## 2019-08-10 LAB — CBC WITH DIFFERENTIAL/PLATELET
Abs Immature Granulocytes: 0.02 10*3/uL (ref 0.00–0.07)
Basophils Absolute: 0 10*3/uL (ref 0.0–0.1)
Basophils Relative: 1 %
Eosinophils Absolute: 0.3 10*3/uL (ref 0.0–0.5)
Eosinophils Relative: 4 %
HCT: 40.2 % (ref 36.0–46.0)
Hemoglobin: 13.6 g/dL (ref 12.0–15.0)
Immature Granulocytes: 0 %
Lymphocytes Relative: 32 %
Lymphs Abs: 2.6 10*3/uL (ref 0.7–4.0)
MCH: 30.8 pg (ref 26.0–34.0)
MCHC: 33.8 g/dL (ref 30.0–36.0)
MCV: 91 fL (ref 80.0–100.0)
Monocytes Absolute: 0.5 10*3/uL (ref 0.1–1.0)
Monocytes Relative: 6 %
Neutro Abs: 4.7 10*3/uL (ref 1.7–7.7)
Neutrophils Relative %: 57 %
Platelets: 275 10*3/uL (ref 150–400)
RBC: 4.42 MIL/uL (ref 3.87–5.11)
RDW: 12.5 % (ref 11.5–15.5)
WBC: 8.1 10*3/uL (ref 4.0–10.5)
nRBC: 0 % (ref 0.0–0.2)

## 2019-08-10 LAB — URINALYSIS, ROUTINE W REFLEX MICROSCOPIC
Bilirubin Urine: NEGATIVE
Glucose, UA: NEGATIVE mg/dL
Hgb urine dipstick: NEGATIVE
Ketones, ur: NEGATIVE mg/dL
Leukocytes,Ua: NEGATIVE
Nitrite: NEGATIVE
Protein, ur: NEGATIVE mg/dL
Specific Gravity, Urine: >1.03 — ABNORMAL HIGH (ref 1.005–1.030)
pH: 6 (ref 5.0–8.0)

## 2019-08-10 LAB — TROPONIN I (HIGH SENSITIVITY): Troponin I (High Sensitivity): <2 ng/L (ref <18)

## 2019-08-10 LAB — D-DIMER, QUANTITATIVE: D-Dimer, Quant: 0.37 ug/mL-FEU (ref 0.00–0.50)

## 2019-08-10 LAB — HCG, SERUM, QUALITATIVE: Preg, Serum: NEGATIVE

## 2019-08-10 LAB — LIPASE, BLOOD: Lipase: 43 U/L (ref 11–51)

## 2019-08-10 MED ORDER — MORPHINE SULFATE (PF) 4 MG/ML IV SOLN
4.0000 mg | Freq: Once | INTRAVENOUS | Status: AC
  Administered 2019-08-10: 4 mg via INTRAVENOUS
  Filled 2019-08-10: qty 1

## 2019-08-10 MED ORDER — IOHEXOL 300 MG/ML  SOLN
100.0000 mL | Freq: Once | INTRAMUSCULAR | Status: AC | PRN
  Administered 2019-08-10: 100 mL via INTRAVENOUS

## 2019-08-10 MED ORDER — PREDNISONE 50 MG PO TABS
60.0000 mg | ORAL_TABLET | Freq: Once | ORAL | Status: AC
  Administered 2019-08-10: 60 mg via ORAL
  Filled 2019-08-10: qty 1

## 2019-08-10 MED ORDER — SODIUM CHLORIDE 0.9 % IV BOLUS
500.0000 mL | Freq: Once | INTRAVENOUS | Status: AC
  Administered 2019-08-10: 500 mL via INTRAVENOUS

## 2019-08-10 MED ORDER — ONDANSETRON HCL 4 MG/2ML IJ SOLN
4.0000 mg | Freq: Once | INTRAMUSCULAR | Status: AC
  Administered 2019-08-10: 4 mg via INTRAVENOUS
  Filled 2019-08-10: qty 2

## 2019-08-10 MED ORDER — HYDROCODONE-ACETAMINOPHEN 5-325 MG PO TABS: 1.0000 | ORAL_TABLET | Freq: Four times a day (QID) | ORAL | 0 refills | Status: AC | PRN

## 2019-08-10 MED ORDER — METHYLPREDNISOLONE 4 MG PO TBPK
ORAL_TABLET | ORAL | 0 refills | Status: DC
Start: 2019-08-10 — End: 2020-12-04

## 2019-08-10 NOTE — ED Notes (Signed)
ED Provider at bedside. 

## 2019-08-10 NOTE — Discharge Instructions (Addendum)
Your workup today in the ER did not show a clear cause of your symptoms, but did rule out several potential emergencies.  I suspect your pain may be coming from your thoracic spine.  It is difficult to diagnose this without an MRI.  We talked about treatment with pain medicines and steroids for a few days.  You should establish care with a primary care provider to continue working this up moving forward.  Please bring your records to your doctor's office during your first visit.  Try to avoid heavy lifting or bending over for the next 2-3 weeks.  This condition can take 4-6 weeks to resolve if it is a spinal disc problem.    You can also take motrin 600 mg every 6 hours (with food) for your pain.  If your pain is becoming unbearable at home, or you have vomiting and can't keep down fluids, or develop numbness or weakness in your arms or legs, please return to the ER.  You need follow up with your doctor for further liver evaluation.

## 2019-08-10 NOTE — ED Notes (Signed)
Patient transported to CT 

## 2019-08-10 NOTE — ED Triage Notes (Signed)
L thoracic and low back pain x 2 weeks. No known injury.

## 2019-08-10 NOTE — ED Provider Notes (Signed)
[facility] EMERGENCY DEPARTMENT Provider Note   CSN: [id number] Arrival date & time: 08/10/19  1040     History Chief Complaint  Patient presents with  . Back Pain    [name] is a 34 y.o. female with a past medical history of hypertension who presents today for evaluation of back pain.  She reports that over the past 2 weeks she has had worsening left-sided pain.  She states that her pain is in her left lower thoracic back.  Her pain has been worsening.  She has not come in yet to get this checked as her 36-year-old son was in the hospital due to his cystic fibrosis.  She has vaccinated against Covid.  She reports that she is unable to take a full deep breath due to pain.  She denies any cough.  She additionally reports abdominal pain.  She states that she initially was able to take the edge off with Tylenol and ibuprofen 2 weeks ago however those are now not helping.  She denies any known specific injury however does note that about 4 weeks ago her son was hospitalized and she was sleeping funny however that got better. No change with eating however notes poor appetite.  Nauseous without vomiting or diarrhea.    HPI     Past Medical History:  Diagnosis Date  . Hypertension     Patient Active Problem List   Diagnosis Date Noted  . Spontaneous vaginal delivery 05/03/2013    Class: Status post  . Normal labor and delivery 05/02/2013  . Fetus affected by placental abruption 02/25/2013    Class: Hospitalized for    Past Surgical History:  Procedure Laterality Date  . INNER EAR SURGERY     Left  . NO PAST SURGERIES       OB History    Gravida  2   Para  2   Term  2   Preterm  0   AB  0   Living  2     SAB  0   TAB  0   Ectopic  0   Multiple  0   Live Births  1           Family History  Problem Relation Age of Onset  . Diabetes Father     Social History   Tobacco Use  . Smoking status: Current Some Day Smoker  . Smokeless  tobacco: Never Used  Substance Use Topics  . Alcohol use: No  . Drug use: No    Home Medications Prior to Admission medications   Medication Sig Start Date End Date Taking? Authorizing Provider  LORazepam (ATIVAN) 0.5 MG tablet Take 0.5-1 mg by mouth daily as needed. 07/12/19  Yes [provider]  cyclobenzaprine (FLEXERIL) 10 MG tablet Take 1 tablet (10 mg total) by mouth 2 (two) times daily as needed for muscle spasms. 03/09/14   Elpidio AnisUpstill, Shari, PA-C  HYDROcodone-acetaminophen (NORCO/VICODIN) 5-325 MG tablet Take 1 tablet by mouth every 6 (six) hours as needed for up to 5 days for moderate pain. 08/10/19 08/15/19  Terald Sleeperrifan, Matthew J, MD  hydrOXYzine (ATARAX/VISTARIL) 25 MG tablet Take 1 tablet (25 mg total) by mouth every 6 (six) hours. Patient not taking: Reported on 03/09/2014 10/18/13   Pisciotta, Suda ReiningNicole, PA-C  ibuprofen (ADVIL,MOTRIN) 800 MG tablet Take 1 tablet (800 mg total) by mouth 3 (three) times daily. 03/09/14   Elpidio AnisUpstill, Shari, PA-C  methylPREDNISolone (MEDROL DOSEPAK) 4 MG TBPK tablet Take as directed 08/10/19  Terald Sleeper, MD    Allergies    Sulfa antibiotics  Review of Systems   Review of Systems  Constitutional: Positive for appetite change. Negative for chills and fever.  Eyes: Negative for visual disturbance.  Respiratory:       Unable to take a full deep breath due to pain  Cardiovascular: Negative for palpitations and leg swelling.  Gastrointestinal: Positive for abdominal pain and nausea.  Genitourinary: Positive for vaginal bleeding (Currently menstruating, is consistent with a normal menstrual cycle for her). Negative for dysuria, pelvic pain, urgency and vaginal discharge.  Musculoskeletal: Positive for back pain. Negative for neck pain.  Skin: Negative for color change, rash and wound.  Neurological: Negative for weakness.  All other systems reviewed and are negative.   Physical Exam Updated Vital Signs BP (!) 133/99 (BP Location: Left Arm)    Pulse 66   Temp 98.8 F (37.1 C) (Oral)   Resp 16   Ht 5\' 8"  (1.727 m)   Wt 89.4 kg   LMP 08/08/2019   SpO2 99%   BMI 29.95 kg/m   Physical Exam Vitals and nursing note reviewed.  Constitutional:      Appearance: She is well-developed.     Comments: Appears uncomfortable  HENT:     Head: Normocephalic and atraumatic.  Eyes:     General: No scleral icterus.       Right eye: No discharge.        Left eye: No discharge.     Conjunctiva/sclera: Conjunctivae normal.  Cardiovascular:     Rate and Rhythm: Normal rate and regular rhythm.     Pulses: Normal pulses.     Heart sounds: Normal heart sounds.  Pulmonary:     Effort: Pulmonary effort is normal. No respiratory distress.     Breath sounds: Normal breath sounds. No stridor.  Abdominal:     General: There is no distension.     Palpations: Abdomen is soft.     Tenderness: There is abdominal tenderness in the right upper quadrant, right lower quadrant, epigastric area and left upper quadrant. There is left CVA tenderness (Mild). There is no right CVA tenderness.  Musculoskeletal:        General: No deformity.     Cervical back: Normal range of motion.     Comments: Unable to recreate or exacerbate her reported back pain with palpation of cervical, thoracic, or lumbar midline or laterally/paraspinal muscles.    Skin:    General: Skin is warm and dry.  Neurological:     General: No focal deficit present.     Mental Status: She is alert and oriented to person, place, and time.     Motor: No abnormal muscle tone.  Psychiatric:        Behavior: Behavior normal.     ED Results / Procedures / Treatments   Labs (all labs ordered are listed, but only abnormal results are displayed) Labs Reviewed  COMPREHENSIVE METABOLIC PANEL - Abnormal; Notable for the following components:      Result Value   Total Bilirubin 0.1 (*)    All other components within normal limits  URINALYSIS, ROUTINE W REFLEX MICROSCOPIC - Abnormal; Notable  for the following components:   Specific Gravity, Urine >1.030 (*)    All other components within normal limits  LIPASE, BLOOD  CBC WITH DIFFERENTIAL/PLATELET  HCG, SERUM, QUALITATIVE  D-DIMER, QUANTITATIVE (NOT AT [facility])  TROPONIN I (HIGH SENSITIVITY)    EKG EKG Interpretation  Date/Time:  Saturday  August 10 2019 11:44:59 EDT Ventricular Rate:  96 PR Interval:    QRS Duration: 90 QT Interval:  360 QTC Calculation: 455 R Axis:   77 Text Interpretation: Sinus rhythm No STEMI Confirmed by Alvester Chou [phone]) on 08/10/2019 11:53:21 AM   Radiology CT Abdomen Pelvis W Contrast  Result Date: 08/10/2019 CLINICAL DATA:  L thoracic and low back pain x 2 weeks. No known injury. EXAM: CT ABDOMEN AND PELVIS WITH CONTRAST TECHNIQUE: Multidetector CT imaging of the abdomen and pelvis was performed using the standard protocol following bolus administration of intravenous contrast. CONTRAST:  OMNIPAQUE IOHEXOL 300 MG/ML  SOLN COMPARISON:  CT abdomen 01/15/2003 FINDINGS: Lower chest: No acute abnormality. Hepatobiliary: In the anterior liver there is a hypoechoic linear opacity extending to the capsule (series 2, image 17) which is nonspecific and could represent sequela of prior trauma. No adjacent hematoma. No other focal liver abnormality identified. Normal appearance of the gallbladder. No intra or extrahepatic biliary duct dilation. Pancreas: Unremarkable. No pancreatic ductal dilatation or surrounding inflammatory changes. Spleen: Normal in size without focal abnormality. Adrenals/Urinary Tract: Adrenal glands are unremarkable. Mild fullness of the right renal collecting system without an obstructing stone. Normal left kidney. No left hydronephrosis. Urinary bladder is unremarkable. Stomach/Bowel: Stomach is within normal limits. Appendix appears normal. No evidence of bowel wall thickening, distention, or inflammatory changes. Vascular/Lymphatic: No significant vascular findings are present. No  enlarged abdominal or pelvic lymph nodes. Reproductive: Uterus and bilateral adnexa are unremarkable. Other: No abdominal wall hernia or abnormality. No abdominopelvic ascites. Musculoskeletal: No acute or significant osseous findings. IMPRESSION: 1. In the anterior liver there is a hypoechoic linear opacity extending to the capsule which is nonspecific and could represent sequela of prior trauma. Given the unusual appearance in the absence of a history of trauma, recommend liver MRI. 2. Very mild nonspecific fullness of the right renal collecting system without an obstructing stone. 3. No finding to explain the patient's left-sided back/abdominal pain. Electronically Signed   By: Emmaline Kluver M.D.   On: 08/10/2019 13:34    Procedures Procedures (including critical care time)  Medications Ordered in ED Medications  sodium chloride 0.9 % bolus 500 mL (0 mLs Intravenous Stopped 08/10/19 1316)  ondansetron (ZOFRAN) injection 4 mg (4 mg Intravenous Given 08/10/19 1156)  morphine 4 MG/ML injection 4 mg (4 mg Intravenous Given 08/10/19 1157)  iohexol (OMNIPAQUE) 300 MG/ML solution 100 mL (100 mLs Intravenous Contrast Given 08/10/19 1242)  morphine 4 MG/ML injection 4 mg (4 mg Intravenous Given 08/10/19 1330)  predniSONE (DELTASONE) tablet 60 mg (60 mg Oral Given 08/10/19 1458)    ED Course  I have reviewed the triage vital signs and the nursing notes.  Pertinent labs & imaging results that were available during my care of the patient were reviewed by me and considered in my medical decision making (see chart for details).    MDM Rules/Calculators/A&P                         Patient is a 34 year old woman who presents today for evaluation of back pain worsening over 2 weeks.  On initial exam she appears uncomfortable.  She is afebrile, not tachycardic or tachypneic, does not meet SIRS/sepsis criteria.  CBC, CMP are unremarkable.  UA is consistent with dehydration however no evidence of infection.   Lipase and pregnancy test are negative.  Given that her pain in the thoracic back could be both abdominal or chest structures troponin  was obtained which was not elevated.  EKG is reassuring.  D-dimer is not elevated.  CT abdomen pelvis was obtained without cause for patient's symptoms found.  Did find a lesion on her liver with recommend MRI outpatient follow-up.  After all results return patient was seen by Dr. Renaye Rakers who discharged the patient.  Note: Portions of this report may have been transcribed using voice recognition software. Every effort was made to ensure accuracy; however, inadvertent computerized transcription errors may be present    Final Clinical Impression(s) / ED Diagnoses Final diagnoses:  Generalized abdominal pain  Acute bilateral thoracic back pain    Rx / DC Orders ED Discharge Orders         Ordered    methylPREDNISolone (MEDROL DOSEPAK) 4 MG TBPK tablet     Discontinue  Reprint     08/10/19 1413    HYDROcodone-acetaminophen (NORCO/VICODIN) 5-325 MG tablet  Every 6 hours PRN     Discontinue  Reprint     08/10/19 1413           Cristina Gong, [place] 08/10/19 1820    Terald Sleeper, MD 08/11/19 [phone]

## 2019-09-09 ENCOUNTER — Ambulatory Visit: Payer: 59 | Admitting: Nurse Practitioner

## 2020-12-02 ENCOUNTER — Encounter (HOSPITAL_COMMUNITY): Payer: Self-pay | Admitting: Registered Nurse

## 2020-12-02 ENCOUNTER — Ambulatory Visit (HOSPITAL_COMMUNITY)
Admission: EM | Admit: 2020-12-02 | Discharge: 2020-12-02 | Disposition: A | Discharge status: Home / Self Care | Payer: BC Managed Care – PPO | Attending: Registered Nurse | Admitting: Registered Nurse

## 2020-12-02 DIAGNOSIS — F4310 Post-traumatic stress disorder, unspecified: Secondary | ICD-10-CM | POA: Diagnosis present

## 2020-12-02 DIAGNOSIS — F332 Major depressive disorder, recurrent severe without psychotic features: Secondary | ICD-10-CM | POA: Diagnosis present

## 2020-12-02 DIAGNOSIS — R4588 Nonsuicidal self-harm: Secondary | ICD-10-CM | POA: Diagnosis present

## 2020-12-02 DIAGNOSIS — Z638 Other specified problems related to primary support group: Secondary | ICD-10-CM
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
## 2020-12-02 NOTE — ED Provider Notes (Signed)
Behavioral Health Urgent Care Medical Screening Exam  Patient Name: Angel Carpenter MRN: [id number] Date of Evaluation: 12/02/20 Chief Complaint:   Diagnosis:  Final diagnoses:  PTSD (post-traumatic stress disorder)  Nonsuicidal self-harm (HCC)  Family discord  MDD (major depressive disorder), recurrent severe, without psychosis (HCC)    History of Present illness: Angel Carpenter is a 35 y.o. female patient presented to [facility] as a walk in with complaints of worsening depression and anxiety.  Also having thoughts of self-harm.  Angel Carpenter, 35 y.o., female patient seen face to face by this provider, consulted with Dr. Earlene Plater; and chart reviewed on 12/02/20.  On evaluation Angel Carpenter reports she has been under a lot of stress at home related to her mother has been trying to separate and has been in his family billing.  Reports that she has had worsening anxiety and depression.  "Today I was under so much stress I was having thoughts about could not.  Usually I would use something like a box cutter and make superficial cuts, but today I was thinking about cutting and live just so I can see the blood run down and it scared me.  I was actually referred here by my therapist."  Patient reports she has a family history of suicide attempt.  Reports her father committed suicide a couple years ago.  Patient denies prior suicide attempt.  At this time she also denies suicidal ideation.  Reports she has been self-harm free for a while and does not want to relapse.  Patient seeking services to help her understand why she wants to self-harm.  Currently patient has outpatient counseling with [facility] and is receiving therapy weekly but feels she needs more.  Discussed intensive outpatient and patient is interested. During evaluation Angel Carpenter is sitting upright in chair (position) in no acute distress.  She is alert/oriented x 4; calm/cooperative; and mood congruent with affect.  She is  speaking in a clear tone at moderate volume, and normal pace; with good eye contact.  Her thought process is coherent and relevant; There is no indication that she is currently responding to internal/external stimuli or experiencing delusional thought content; and she has denied suicidal/self-harm/homicidal ideation, psychosis, and paranoia.   Patient has remained calm throughout assessment and has answered questions appropriately.    Psychiatric Specialty Exam  Presentation  General Appearance:Appropriate for Environment; Casual  Eye Contact:Good  Speech:Clear and Coherent; Normal Rate  Speech Volume:Normal  Handedness:Right   Mood and Affect  Mood:Anxious; Dysphoric  Affect:Appropriate; Congruent   Thought Process  Thought Processes:Coherent; Goal Directed  Descriptions of Associations:Intact  Orientation:Full (Time, Place and Person)  Thought Content:WDL    Hallucinations:None  Ideas of Reference:None  Suicidal Thoughts:No (History of self harm and has been a while since cut but having thoughts of cutting)  Homicidal Thoughts:No   Sensorium  Memory:Immediate Good; Recent Good; Remote Good  Judgment:Intact  Insight:Present   Executive Functions  Concentration:No data recorded Attention Span:Good  Recall:Good  Fund of Knowledge:Good  Language:Good   Psychomotor Activity  Psychomotor Activity:Normal   Assets  Assets:Communication Skills; Desire for Improvement; Financial Resources/Insurance; Resilience; Social Support; Transportation   Sleep  Sleep:Good  Number of hours: No data recorded  Nutritional Assessment (For OBS and FBC admissions only) Has the patient had a weight loss or gain of 10 pounds or more in the last 3 months?: No Has the patient had a decrease in food intake/or appetite?: No Does the patient have dental problems?: No Does  the patient have eating habits or behaviors that may be indicators of an eating disorder including  binging or inducing vomiting?: No Has the patient recently lost weight without trying?: 0 Has the patient been eating poorly because of a decreased appetite?: 0 Malnutrition Screening Tool Score: 0    Physical Exam: Physical Exam Vitals and nursing note reviewed. Exam conducted with a chaperone present.  Constitutional:      General: She is not in acute distress.    Appearance: Normal appearance. She is not ill-appearing.  Cardiovascular:     Rate and Rhythm: Normal rate.  Pulmonary:     Effort: Pulmonary effort is normal.  Musculoskeletal:        General: Normal range of motion.     Cervical back: Normal range of motion.  Skin:    General: Skin is warm and dry.  Neurological:     Mental Status: She is alert and oriented to person, place, and time.  Psychiatric:        Attention and Perception: Attention and perception normal. She does not perceive auditory or visual hallucinations.        Mood and Affect: Affect normal. Mood is anxious and depressed.        Speech: Speech normal.        Behavior: Behavior normal. Behavior is cooperative.        Thought Content: Thought content normal. Thought content is not paranoid or delusional. Thought content does not include homicidal or suicidal ideation.        Cognition and Memory: Cognition and memory normal.        Judgment: Judgment normal.   Review of Systems  Constitutional: Negative.   HENT: Negative.    Eyes: Negative.   Respiratory: Negative.    Cardiovascular: Negative.   Gastrointestinal: Negative.   Genitourinary: Negative.   Musculoskeletal: Negative.   Skin: Negative.   Neurological: Negative.   Endo/Heme/Allergies: Negative.   Psychiatric/Behavioral:  Positive for depression (Stable but worsening). Negative for hallucinations and substance abuse. Suicidal ideas: Denies.The patient does not have insomnia. Nervous/anxious: Stable but worsening.       Thoughts of self harm worsening  Blood pressure (!) 124/91, pulse  (!) 105, temperature 98.5 F (36.9 C), temperature source Oral, resp. rate 18, SpO2 100 %. There is no height or weight on file to calculate BMI.  Musculoskeletal: Strength & Muscle Tone: within normal limits Gait & Station: normal Patient leans: N/A   [place] [facility] Discharge Disposition for Follow up and Recommendations: Based on my evaluation the patient does not appear to have an emergency medical condition and can be discharged with resources and follow up care in outpatient services for Medication Management, Partial Hospitalization Program, Group Therapy, and IOP    Discharge Instructions      You have been referred to Intensive Outpatient Treatment (IOP) with  Health. You should hear from Dellia Nims by 10am tomorrow.  If you don't hear from her by then, please call the number below.  [facility]  Address: [address] [place], [place] [address] Phone: [phone]  Another option for IOP would be:  [facility] Gr[facility]es: Address: [address], [place], [place] [address] Phone: [phone]     [name], NP 12/02/2020, 5:43 PM

## 2020-12-02 NOTE — Discharge Instructions (Addendum)
You have been referred to Intensive Outpatient Treatment (IOP) with [facility]. You should hear from Angel Carpenter by 10am tomorrow.  If you don't hear from her by then, please call the number below.  [facility] Out[facility]  Address: [address] [place], [place] [address] Phone: [phone]  Another option for IOP would be:  Angel Carpenter: Address: [address], [place], [place] [address] Phone: [phone]   Safety Plan Angel Carpenter will reach out to Angel Carpenter (her friend), call 911 or call mobile crisis, or go to the nearest emergency room if condition worsens or if suicidal thoughts become active Patients' will follow up with [facility] (IOP) Intensive Outpatient Program for outpatient psychiatric services (therapy/medication management).  The suicide prevention education provided includes the following: Suicide risk factors Suicide prevention and interventions National Suicide Hotline telephone number [facility] assessment telephone number [facility] Emergency Assistance 911 [facility] and/or Residential Mobile Crisis Unit telephone number Request made to Patient Remove weapons (e.g., guns, rifles, knives), all items previously/currently identified as safety concern.   Remove drugs/medications (over the counter, prescriptions, illicit drugs), all items previously/currently identified as a safety concern.

## 2020-12-02 NOTE — Progress Notes (Signed)
Angel Carpenter received her AVS, questions answered and discharged without incident.

## 2020-12-03 ENCOUNTER — Other Ambulatory Visit: Payer: Self-pay

## 2020-12-03 ENCOUNTER — Encounter (HOSPITAL_BASED_OUTPATIENT_CLINIC_OR_DEPARTMENT_OTHER): Payer: Self-pay

## 2020-12-03 ENCOUNTER — Emergency Department (HOSPITAL_BASED_OUTPATIENT_CLINIC_OR_DEPARTMENT_OTHER)
Admission: EM | Admit: 2020-12-03 | Discharge: 2020-12-04 | Disposition: A | Discharge status: Other Acute Inpatient Hospital | Payer: 59 | Attending: Emergency Medicine | Admitting: Emergency Medicine

## 2020-12-03 DIAGNOSIS — F17200 Nicotine dependence, unspecified, uncomplicated: Secondary | ICD-10-CM | POA: Diagnosis not present

## 2020-12-03 DIAGNOSIS — R45851 Suicidal ideations: Secondary | ICD-10-CM | POA: Insufficient documentation

## 2020-12-03 DIAGNOSIS — R109 Unspecified abdominal pain: Secondary | ICD-10-CM | POA: Insufficient documentation

## 2020-12-03 DIAGNOSIS — F332 Major depressive disorder, recurrent severe without psychotic features: Secondary | ICD-10-CM | POA: Insufficient documentation

## 2020-12-03 DIAGNOSIS — F4389 Other reactions to severe stress: Secondary | ICD-10-CM | POA: Insufficient documentation

## 2020-12-03 DIAGNOSIS — I10 Essential (primary) hypertension: Secondary | ICD-10-CM | POA: Insufficient documentation

## 2020-12-03 DIAGNOSIS — Z79899 Other long term (current) drug therapy: Secondary | ICD-10-CM | POA: Diagnosis not present

## 2020-12-03 DIAGNOSIS — Z20822 Contact with and (suspected) exposure to covid-19: Secondary | ICD-10-CM | POA: Diagnosis not present

## 2020-12-03 HISTORY — DX: Depression, unspecified: F32.A

## 2020-12-03 LAB — COMPREHENSIVE METABOLIC PANEL
ALT: 15 U/L (ref 0–44)
AST: 17 U/L (ref 15–41)
Albumin: 3.7 g/dL (ref 3.5–5.0)
Alkaline Phosphatase: 56 U/L (ref 38–126)
Anion gap: 8 (ref 5–15)
BUN: 9 mg/dL (ref 6–20)
CO2: 24 mmol/L (ref 22–32)
Calcium: 8.9 mg/dL (ref 8.9–10.3)
Chloride: 107 mmol/L (ref 98–111)
Creatinine, Ser: 0.78 mg/dL (ref 0.44–1.00)
GFR, Estimated: >60 mL/min (ref >60)
Glucose, Bld: 97 mg/dL (ref 70–99)
Potassium: 3.4 mmol/L — ABNORMAL LOW (ref 3.5–5.1)
Sodium: 139 mmol/L (ref 135–145)
Total Bilirubin: 0.3 mg/dL (ref 0.3–1.2)
Total Protein: 6.3 g/dL — ABNORMAL LOW (ref 6.5–8.1)

## 2020-12-03 LAB — PREGNANCY, URINE: Preg Test, Ur: NEGATIVE

## 2020-12-03 LAB — RESP PANEL BY RT-PCR (FLU A&B, COVID) ARPGX2
Influenza A by PCR: NEGATIVE
Influenza B by PCR: NEGATIVE
SARS Coronavirus 2 by RT PCR: NEGATIVE

## 2020-12-03 LAB — SALICYLATE LEVEL: Salicylate Lvl: <7 mg/dL — ABNORMAL LOW (ref 7.0–30.0)

## 2020-12-03 LAB — CBC
HCT: 36.5 % (ref 36.0–46.0)
Hemoglobin: 12.9 g/dL (ref 12.0–15.0)
MCH: 32.1 pg (ref 26.0–34.0)
MCHC: 35.3 g/dL (ref 30.0–36.0)
MCV: 90.8 fL (ref 80.0–100.0)
Platelets: 245 10*3/uL (ref 150–400)
RBC: 4.02 MIL/uL (ref 3.87–5.11)
RDW: 12.2 % (ref 11.5–15.5)
WBC: 8 10*3/uL (ref 4.0–10.5)
nRBC: 0 % (ref 0.0–0.2)

## 2020-12-03 LAB — RAPID URINE DRUG SCREEN, HOSP PERFORMED
Amphetamines: NOT DETECTED
Barbiturates: NOT DETECTED
Benzodiazepines: POSITIVE — AB
Cocaine: NOT DETECTED
Opiates: NOT DETECTED
Tetrahydrocannabinol: POSITIVE — AB

## 2020-12-03 LAB — ETHANOL: Alcohol, Ethyl (B): <10 mg/dL (ref <10)

## 2020-12-03 LAB — ACETAMINOPHEN LEVEL: Acetaminophen (Tylenol), Serum: <10 ug/mL — ABNORMAL LOW (ref 10–30)

## 2020-12-03 NOTE — ED Provider Notes (Signed)
[facility] EMERGENCY DEPARTMENT Provider Note   CSN: [id number] Arrival date & time: 12/03/20  1959     History Chief Complaint  Patient presents with   Suicidal    Angel Carpenter is a 34 y.o. female.  35 yo F with a chief complaints of suicidal ideation.  The patient states that she is worried that she is going to try and commit suicide.  She was seen at behavioral health urgent care yesterday and was felt okay to follow-up in intensive outpatient therapy today.  She tells me that she had worsening depression and she took a handful of benzodiazepines and then decided to come to the ED for evaluation.  She denies any other coingestants.  Has been not taking her maintenance behavioral health medicine months for about a week.  She denies medical complaint denies cough congestion fever nausea vomiting diarrhea chest pain shortness of breath.  She later states that she has had a little bit of an upset stomach and sometimes feels short of breath she thinks secondary to when she has increased anxiety.  The history is provided by the patient.  Illness Severity:  Moderate Onset quality:  Gradual Duration:  2 weeks Timing:  Constant Progression:  Worsening Chronicity:  New Associated symptoms: no chest pain, no congestion, no fever, no headaches, no myalgias, no nausea, no rhinorrhea, no shortness of breath, no vomiting and no wheezing       Past Medical History:  Diagnosis Date   Depression    Hypertension     Patient Active Problem List   Diagnosis Date Noted   Family discord 12/02/2020   MDD (major depressive disorder), recurrent severe, without psychosis ([place]) 12/02/2020   PTSD (post-traumatic stress disorder) 12/02/2020   Nonsuicidal self-harm ([place]) 12/02/2020   Spontaneous vaginal delivery 05/03/2013    Class: Status post   Normal labor and delivery 05/02/2013   Fetus affected by placental abruption 02/25/2013    Class: Hospitalized for    Past Surgical  History:  Procedure Laterality Date   INNER EAR SURGERY     Left   NO PAST SURGERIES       OB History     Gravida  2   Para  2   Term  2   Preterm  0   AB  0   Living  2      SAB  0   IAB  0   Ectopic  0   Multiple  0   Live Births  1           Family History  Problem Relation Age of Onset   Diabetes Father     Social History   Tobacco Use   Smoking status: Some Days   Smokeless tobacco: Never  Vaping Use   Vaping Use: Never used  Substance Use Topics   Alcohol use: No   Drug use: Yes    Comment: delta gummies    Home Medications Prior to Admission medications   Medication Sig Start Date End Date Taking? Authorizing Provider  cyclobenzaprine (FLEXERIL) 10 MG tablet Take 1 tablet (10 mg total) by mouth 2 (two) times daily as needed for muscle spasms. 03/09/14   Charlann Lange, PA-C  hydrOXYzine (ATARAX/VISTARIL) 25 MG tablet Take 1 tablet (25 mg total) by mouth every 6 (six) hours. Patient not taking: Reported on 03/09/2014 10/18/13   Pisciotta, Elmyra Ricks, PA-C  ibuprofen (ADVIL,MOTRIN) 800 MG tablet Take 1 tablet (800 mg total) by mouth 3 (three) times daily.  03/09/14   Elpidio Anis, PA-C  LORazepam (ATIVAN) 0.5 MG tablet Take 0.5-1 mg by mouth daily as needed. 07/12/19   [provider]  methylPREDNISolone (MEDROL DOSEPAK) 4 MG TBPK tablet Take as directed 08/10/19   Terald Sleeper, MD    Allergies    Sulfa antibiotics  Review of Systems   Review of Systems  Constitutional:  Negative for chills and fever.  HENT:  Negative for congestion and rhinorrhea.   Eyes:  Negative for redness and visual disturbance.  Respiratory:  Negative for shortness of breath and wheezing.   Cardiovascular:  Negative for chest pain and palpitations.  Gastrointestinal:  Negative for nausea and vomiting.  Genitourinary:  Negative for dysuria and urgency.  Musculoskeletal:  Negative for arthralgias and myalgias.  Skin:  Negative for pallor and wound.   Neurological:  Negative for dizziness and headaches.  Psychiatric/Behavioral:  Positive for dysphoric mood and suicidal ideas.    Physical Exam Updated Vital Signs BP (!) 139/96 (BP Location: Left Leg)   Pulse (!) 112   Temp 98.6 F (37 C) (Oral)   Resp 20   Ht 5\' 7"  (1.702 m)   Wt 69.4 kg   LMP 11/30/2020 (Exact Date)   SpO2 97%   BMI 23.96 kg/m   Physical Exam Vitals and nursing note reviewed.  Constitutional:      General: She is not in acute distress.    Appearance: She is well-developed. She is not diaphoretic.  HENT:     Head: Normocephalic and atraumatic.  Eyes:     Pupils: Pupils are equal, round, and reactive to light.  Cardiovascular:     Rate and Rhythm: Normal rate and regular rhythm.     Heart sounds: No murmur heard.   No friction rub. No gallop.  Pulmonary:     Effort: Pulmonary effort is normal.     Breath sounds: No wheezing or rales.  Abdominal:     General: There is no distension.     Palpations: Abdomen is soft.     Tenderness: There is no abdominal tenderness.  Musculoskeletal:        General: No tenderness.     Cervical back: Normal range of motion and neck supple.  Skin:    General: Skin is warm and dry.  Neurological:     Mental Status: She is alert and oriented to person, place, and time.  Psychiatric:        Behavior: Behavior normal.    ED Results / Procedures / Treatments   Labs (all labs ordered are listed, but only abnormal results are displayed) Labs Reviewed  COMPREHENSIVE METABOLIC PANEL - Abnormal; Notable for the following components:      Result Value   Potassium 3.4 (*)    Total Protein 6.3 (*)    All other components within normal limits  SALICYLATE LEVEL - Abnormal; Notable for the following components:   Salicylate Lvl <7.0 (*)    All other components within normal limits  ACETAMINOPHEN LEVEL - Abnormal; Notable for the following components:   Acetaminophen (Tylenol), Serum <10 (*)    All other components within  normal limits  RAPID URINE DRUG SCREEN, HOSP PERFORMED - Abnormal; Notable for the following components:   Benzodiazepines POSITIVE (*)    Tetrahydrocannabinol POSITIVE (*)    All other components within normal limits  RESP PANEL BY RT-PCR (FLU A&B, COVID) ARPGX2  ETHANOL  CBC  PREGNANCY, URINE    EKG None  Radiology No results found.  Procedures Procedures   Medications Ordered in ED Medications - No data to display  ED Course  I have reviewed the triage vital signs and the nursing notes.  Pertinent labs & imaging results that were available during my care of the patient were reviewed by me and considered in my medical decision making (see chart for details).    MDM Rules/Calculators/A&P                           35 yo F with a cc of SI.  Seen yesterday and cleared at [facility].  Medically clear.  TTS eval.   The patients results and plan were reviewed and discussed.   Any x-rays performed were independently reviewed by myself.   Differential diagnosis were considered with the presenting HPI.  Medications - No data to display  Vitals:   12/03/20 2010 12/03/20 2010  BP:  (!) 139/96  Pulse:  (!) 112  Resp:  20  Temp:  98.6 F (37 C)  TempSrc:  Oral  SpO2:  97%  Weight: 69.4 kg   Height: 5\' 7"  (1.702 m)     Final diagnoses:  Suicidal ideation    Final Clinical Impression(s) / ED Diagnoses Final diagnoses:  Suicidal ideation    Rx / DC Orders ED Discharge Orders     None        Deno Etienne, DO 12/03/20 2246

## 2020-12-03 NOTE — ED Triage Notes (Signed)
Pt was seen at [facility] yesterday. Been having thoughts of self harm. Is in therapy for same. States thoughts today have worsened and have said "I may be better off If I wasn't here". States was thinking of a plan with potentially overdosing on pills. States she doesn't trust herself and wants help.

## 2020-12-03 NOTE — ED Notes (Signed)
Didn't take prescribed wellbutrin today. Took 2 (1mg ) xanax with 2 klonopin (.5mg ) 30 minutes pta.

## 2020-12-04 ENCOUNTER — Encounter (HOSPITAL_COMMUNITY): Payer: Self-pay | Admitting: Psychiatry

## 2020-12-04 ENCOUNTER — Other Ambulatory Visit: Payer: Self-pay | Admitting: Psychiatry

## 2020-12-04 ENCOUNTER — Inpatient Hospital Stay (HOSPITAL_COMMUNITY)
Admission: AD | Admit: 2020-12-04 | Discharge: 2020-12-09 | DRG: 885 | Disposition: A | Discharge status: Home / Self Care | Payer: BC Managed Care – PPO | Source: Intra-hospital | Attending: Emergency Medicine | Admitting: Emergency Medicine

## 2020-12-04 DIAGNOSIS — Z630 Problems in relationship with spouse or partner: Secondary | ICD-10-CM

## 2020-12-04 DIAGNOSIS — F332 Major depressive disorder, recurrent severe without psychotic features: Secondary | ICD-10-CM | POA: Diagnosis present

## 2020-12-04 DIAGNOSIS — Z882 Allergy status to sulfonamides status: Secondary | ICD-10-CM

## 2020-12-04 DIAGNOSIS — R45851 Suicidal ideations: Secondary | ICD-10-CM | POA: Diagnosis present

## 2020-12-04 DIAGNOSIS — G4700 Insomnia, unspecified: Secondary | ICD-10-CM | POA: Diagnosis present

## 2020-12-04 DIAGNOSIS — R634 Abnormal weight loss: Secondary | ICD-10-CM | POA: Diagnosis present

## 2020-12-04 DIAGNOSIS — Z6823 Body mass index (BMI) 23.0-23.9, adult: Secondary | ICD-10-CM

## 2020-12-04 DIAGNOSIS — F4310 Post-traumatic stress disorder, unspecified: Secondary | ICD-10-CM | POA: Diagnosis present

## 2020-12-04 DIAGNOSIS — Z9151 Personal history of suicidal behavior: Secondary | ICD-10-CM

## 2020-12-04 DIAGNOSIS — R4584 Anhedonia: Secondary | ICD-10-CM | POA: Diagnosis present

## 2020-12-04 DIAGNOSIS — Z79899 Other long term (current) drug therapy: Secondary | ICD-10-CM

## 2020-12-04 DIAGNOSIS — F411 Generalized anxiety disorder: Secondary | ICD-10-CM | POA: Diagnosis present

## 2020-12-04 DIAGNOSIS — Z20822 Contact with and (suspected) exposure to covid-19: Secondary | ICD-10-CM | POA: Diagnosis present

## 2020-12-04 DIAGNOSIS — F909 Attention-deficit hyperactivity disorder, unspecified type: Secondary | ICD-10-CM | POA: Diagnosis present

## 2020-12-04 DIAGNOSIS — Z23 Encounter for immunization: Secondary | ICD-10-CM

## 2020-12-04 DIAGNOSIS — Z818 Family history of other mental and behavioral disorders: Secondary | ICD-10-CM

## 2020-12-04 DIAGNOSIS — F1310 Sedative, hypnotic or anxiolytic abuse, uncomplicated: Secondary | ICD-10-CM | POA: Diagnosis present

## 2020-12-04 DIAGNOSIS — R4587 Impulsiveness: Secondary | ICD-10-CM | POA: Diagnosis present

## 2020-12-04 DIAGNOSIS — R630 Anorexia: Secondary | ICD-10-CM | POA: Diagnosis present

## 2020-12-04 DIAGNOSIS — Z62810 Personal history of physical and sexual abuse in childhood: Secondary | ICD-10-CM | POA: Diagnosis present

## 2020-12-04 DIAGNOSIS — F1210 Cannabis abuse, uncomplicated: Secondary | ICD-10-CM | POA: Diagnosis present

## 2020-12-04 DIAGNOSIS — I10 Essential (primary) hypertension: Secondary | ICD-10-CM | POA: Diagnosis present

## 2020-12-04 DIAGNOSIS — Z635 Disruption of family by separation and divorce: Secondary | ICD-10-CM

## 2020-12-04 MED ORDER — ENSURE ENLIVE PO LIQD
237.0000 mL | Freq: Two times a day (BID) | ORAL | Status: DC
  Filled 2020-12-04 (×2): qty 237

## 2020-12-04 MED ORDER — ACETAMINOPHEN 325 MG PO TABS
650.0000 mg | ORAL_TABLET | Freq: Once | ORAL | Status: AC
  Administered 2020-12-04: 650 mg via ORAL
  Filled 2020-12-04 (×2): qty 2

## 2020-12-04 MED ORDER — MELATONIN 3 MG PO TABS
6.0000 mg | ORAL_TABLET | Freq: Every evening | ORAL | Status: DC | PRN
  Administered 2020-12-04 – 2020-12-08 (×5): 6 mg via ORAL
  Filled 2020-12-04 (×5): qty 2

## 2020-12-04 MED ORDER — INFLUENZA VAC SPLIT QUAD 0.5 ML IM SUSY
0.5000 mL | PREFILLED_SYRINGE | INTRAMUSCULAR | Status: AC
  Administered 2020-12-05: 0.5 mL via INTRAMUSCULAR
  Filled 2020-12-04: qty 0.5

## 2020-12-04 MED ORDER — BOOST / RESOURCE BREEZE PO LIQD CUSTOM
1.0000 | Freq: Two times a day (BID) | ORAL | Status: DC
  Administered 2020-12-04 – 2020-12-06 (×4): 1 via ORAL
  Filled 2020-12-04 (×8): qty 1

## 2020-12-04 NOTE — ED Notes (Signed)
Transport initiated to go to [facility] 402-2

## 2020-12-04 NOTE — ED Notes (Signed)
Pt verbally consented for transport. Signature pad not working.
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
## 2020-12-04 NOTE — ED Notes (Signed)
TTS initiated. Pt alert, NAD, calm, cooperative, participatory.

## 2020-12-04 NOTE — Progress Notes (Signed)
Pt accepted to [facility] 402-2    Patient meets inpatient criteria per  Caryn Bee, DNP  The attending provider will be Mass[place], MD    Call report to [phone]    Ella Bodo, RN @ [facility] notified.     Pt scheduled  to arrive at [facility] TODAY by 12 Noon.   Damita Dunnings, MSW, LCSW-A  11:00 AM 12/04/2020

## 2020-12-04 NOTE — Plan of Care (Signed)
  Problem: Education: Goal: Knowledge of [facility] Education information/materials will improve Outcome: Progressing Goal: Mental status will improve Outcome: Progressing   Problem: Coping: Goal: Ability to verbalize frustrations and anger appropriately will improve Outcome: Progressing Goal: Ability to demonstrate self-control will improve Outcome: Progressing   Problem: Coping: Goal: Ability to demonstrate self-control will improve Outcome: Progressing   Problem: Safety: Goal: Ability to disclose and discuss suicidal ideas will improve Outcome: Progressing

## 2020-12-04 NOTE — ED Notes (Signed)
TTS complete, pt up to b/r with chaperone. Steady gait.

## 2020-12-04 NOTE — Group Note (Signed)
LCSW Group Therapy Note   Group Date: 12/04/2020 Start Time: 1300 End Time: 1400   Type of Therapy and Topic:  Group Therapy: Coping Skills   Participation Level:  Active  Due to the acuity and complex discharge plans, group was not held. Patient was provided therapeutic worksheets and asked to meet with CSW as needed.  Aram Beecham, LCSWA 12/04/2020  2:26 PM

## 2020-12-04 NOTE — ED Notes (Addendum)
Verbalizes home meds: Klonopin 0.5 mg TID Wellbutrin 300mg  daily, (but takes only 150mg , stating 300mg  too much)  Has taken: Xanax, (not prescribed)  Has tried: Delta gummies, (they don't work)  PT alert, NAD, calm, interactive. Denies needs.

## 2020-12-04 NOTE — [facility] Group Notes (Signed)
Pt did not attend music therapy group.

## 2020-12-04 NOTE — Progress Notes (Signed)
Patient presents pleasant and cooperative. Denies any SI, HI, AVH. Visible in milieu interacting appropriately with staff and peers. Did attend group this evening. Patient reports needing new mh medications. Reports was sent here because she had been placed on wellbrutin and was not suppose to be on that particular medication. Patient did request melatonin for sleep. Reports she takes 6mg  at home. Given with good relief. No other concerns voiced. Encouragement and support provided. Safety checks maintained. Medications given as prescribed.  Patient receptive and remains safe on unit with q 15 min checks.

## 2020-12-04 NOTE — BH Assessment (Addendum)
Comprehensive Clinical Assessment (CCA) Screening, Triage and Referral Note  12/04/2020 Angel Carpenter [id number] Disposition: Patient care was discussed with Angel Abts, PA.  He recommends inpatient psychiatric care.  RN Angel Carpenter and Dr. [name] were notified of disposition via secure messaging.  Pt to be referred out by CSW and pt to be reviewed by [facility] for possible admission.  Patient has a flat affect and good eye contact.  Patient is oriented x4 .  She does not respond to internal stimuli.  Patient does not evidence any delusional thought process.  She does not report any problems with appetite or sleep.  Pt has current outpatient care.  She is seen by her therapist at [facility].  She has medication managed by her PCP.  Pt last inpatient care was when she was 35 years of age.   Chief Complaint:  Chief Complaint  Patient presents with   Suicidal   Visit Diagnosis: MDD recurrent, severe; PTSD  Patient Reported Information How did you hear about [place]? Self  What Is the Reason for Your Visit/Call Today? Pt was seen on 11/09 at [facility].  She had been referred to intensive outpatient care.  Pt already has a therapist at [facility] named Angel Carpenter.  Patient had taken more of her clonopin than she was supposed to last night.  She took two clonopin and two xanax.  She is not prescribed xanax.  She said "It was in my head" when clinician asked if she had been trying to over dose "so that I would not have to be here."  Father had committed suicide in December '20.  Pt referenced that he had overdosed on his medications.  Pt says "I have a lot of stuff going on it would be easier to not be here."  Pt says she is not sure why things changed so much from yesteray to today.  Pt says that she has no HI or A/V hallucinations.  Pt says she does not use ETOH or other substances.  How Long Has This Been Causing You Problems? > than 6 months  What Do You Feel Would Help You the Most Today?  Treatment for Depression or other mood problem   Have You Recently Had Any Thoughts About Hurting Yourself? Yes  Are You Planning to Commit Suicide/Harm Yourself At This time? Yes   Have you Recently Had Thoughts About Hurting Someone Angel Carpenter? No  Are You Planning to Harm Someone at This Time? No  Explanation: No data recorded  Have You Used Any Alcohol or Drugs in the Past 24 Hours? No (Has used Delta 9 gummies.)  How Long Ago Did You Use Drugs or Alcohol? No data recorded What Did You Use and How Much? No data recorded  Do You Currently Have a Therapist/Psychiatrist? Yes  Name of Therapist/Psychiatrist: [name] w/ Angel Carpenter   Have You Been Recently Discharged From Any Office Practice or Programs? No  Explanation of Discharge From Practice/Program: No data recorded   CCA Screening Triage Referral Assessment Type of Contact: Tele-Assessment  Telemedicine Service Delivery:   Is this Initial or Reassessment? Initial Assessment  Date Telepsych consult ordered in [facility]:  12/03/20  Time Telepsych consult ordered in [facility]:  2036  Location of Assessment: [facility]  Provider Location: [facility] Assessment Services   Collateral Involvement: No data recorded  Does Patient Have a Court Appointed Legal Guardian? No data recorded Name and Contact of Legal Guardian: No data recorded If Minor and Not Living  with Parent(s), Who has Custody? No data recorded Is CPS involved or ever been involved? Never  Is APS involved or ever been involved? Never   Patient Determined To Be At Risk for Harm To Self or Others Based on Review of Patient Reported Information or Presenting Complaint? Yes, for Self-Harm  Method: No data recorded Availability of Means: No data recorded Intent: No data recorded Notification Required: No data recorded Additional Information for Danger to Others Potential: No data recorded Additional Comments for Danger to Others Potential: No data  recorded Are There Guns or Other Weapons in Your Home? No data recorded Types of Guns/Weapons: No data recorded Are These Weapons Safely Secured?                            No data recorded Who Could Verify You Are Able To Have These Secured: No data recorded Do You Have any Outstanding Charges, Pending Court Dates, Parole/Probation? No data recorded Contacted To Inform of Risk of Harm To Self or Others: No data recorded  Does Patient Present under Involuntary Commitment? No  IVC Papers Initial File Date: No data recorded  [place] of Residence: [place]   Patient Currently Receiving the Following Services: Individual Therapy   Determination of Need: Urgent (48 hours)   Options For Referral: Inpatient Hospitalization   Discharge Disposition:     Angel Carpenter, LCAS

## 2020-12-04 NOTE — Progress Notes (Signed)
Patient information has been sent to [facility] via secure chat to review for potential admission. Patient meets inpatient criteria per Caryn Bee, DNP.   Situation ongoing, CSW will continue to monitor progress.    Signed:  Damita Dunnings, MSW, LCSW-A  12/04/2020 10:36 AM

## 2020-12-04 NOTE — BHH Group Notes (Signed)
Pt did not attend relaxation group.  

## 2020-12-04 NOTE — Plan of Care (Signed)
  Problem: Education: Goal: Knowledge of [place] [facility] Education information/materials will improve Outcome: Progressing Goal: Mental status will improve Outcome: Progressing   Problem: Coping: Goal: Ability to verbalize frustrations and anger appropriately will improve Outcome: Progressing Goal: Ability to demonstrate self-control will improve Outcome: Progressing
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
## 2020-12-05 DIAGNOSIS — F332 Major depressive disorder, recurrent severe without psychotic features: Principal | ICD-10-CM

## 2020-12-05 DIAGNOSIS — F1310 Sedative, hypnotic or anxiolytic abuse, uncomplicated: Secondary | ICD-10-CM | POA: Diagnosis present

## 2020-12-05 DIAGNOSIS — F1210 Cannabis abuse, uncomplicated: Secondary | ICD-10-CM | POA: Diagnosis present

## 2020-12-05 LAB — HEMOGLOBIN A1C
Hgb A1c MFr Bld: 4.7 % — ABNORMAL LOW (ref 4.8–5.6)
Mean Plasma Glucose: 88.19 mg/dL

## 2020-12-05 LAB — BASIC METABOLIC PANEL
Anion gap: 7 (ref 5–15)
BUN: 13 mg/dL (ref 6–20)
CO2: 27 mmol/L (ref 22–32)
Calcium: 9.3 mg/dL (ref 8.9–10.3)
Chloride: 103 mmol/L (ref 98–111)
Creatinine, Ser: 0.76 mg/dL (ref 0.44–1.00)
GFR, Estimated: >60 mL/min (ref >60)
Glucose, Bld: 81 mg/dL (ref 70–99)
Potassium: 4.1 mmol/L (ref 3.5–5.1)
Sodium: 137 mmol/L (ref 135–145)

## 2020-12-05 LAB — LIPID PANEL
Cholesterol: 166 mg/dL (ref 0–200)
HDL: 53 mg/dL (ref >40)
LDL Cholesterol: 97 mg/dL (ref 0–99)
Total CHOL/HDL Ratio: 3.1 RATIO
Triglycerides: 80 mg/dL (ref <150)
VLDL: 16 mg/dL (ref 0–40)

## 2020-12-05 LAB — TSH: TSH: 0.312 u[IU]/mL — ABNORMAL LOW (ref 0.350–4.500)

## 2020-12-05 MED ORDER — MAGNESIUM HYDROXIDE 400 MG/5ML PO SUSP: 30.0000 mL | Freq: Every day | ORAL | Status: DC | PRN

## 2020-12-05 MED ORDER — LORAZEPAM 1 MG PO TABS: 1.0000 mg | ORAL_TABLET | Freq: Four times a day (QID) | ORAL | Status: AC | PRN

## 2020-12-05 MED ORDER — HYDROXYZINE HCL 25 MG PO TABS
25.0000 mg | ORAL_TABLET | Freq: Four times a day (QID) | ORAL | Status: AC | PRN
  Administered 2020-12-06 – 2020-12-07 (×2): 25 mg via ORAL
  Filled 2020-12-05 (×2): qty 1

## 2020-12-05 MED ORDER — CLONAZEPAM 0.5 MG PO TABS
0.5000 mg | ORAL_TABLET | Freq: Two times a day (BID) | ORAL | Status: DC
  Administered 2020-12-05 – 2020-12-06 (×2): 0.5 mg via ORAL
  Filled 2020-12-05 (×2): qty 1

## 2020-12-05 MED ORDER — ACETAMINOPHEN 325 MG PO TABS
650.0000 mg | ORAL_TABLET | Freq: Four times a day (QID) | ORAL | Status: DC | PRN
  Administered 2020-12-05 – 2020-12-06 (×2): 650 mg via ORAL
  Filled 2020-12-05: qty 2

## 2020-12-05 MED ORDER — ONDANSETRON 4 MG PO TBDP: 4.0000 mg | ORAL_TABLET | Freq: Four times a day (QID) | ORAL | Status: AC | PRN

## 2020-12-05 MED ORDER — ACETAMINOPHEN 325 MG PO TABS
ORAL_TABLET | ORAL | Status: AC
  Filled 2020-12-05: qty 2

## 2020-12-05 MED ORDER — IBUPROFEN 400 MG PO TABS: 400.0000 mg | ORAL_TABLET | Freq: Four times a day (QID) | ORAL | Status: DC | PRN

## 2020-12-05 MED ORDER — TRAZODONE HCL 50 MG PO TABS: 50.0000 mg | ORAL_TABLET | Freq: Every evening | ORAL | Status: DC | PRN

## 2020-12-05 MED ORDER — ESCITALOPRAM OXALATE 10 MG PO TABS
10.0000 mg | ORAL_TABLET | Freq: Every day | ORAL | Status: DC
  Administered 2020-12-05 – 2020-12-09 (×5): 10 mg via ORAL
  Filled 2020-12-05 (×8): qty 1

## 2020-12-05 MED ORDER — TRAZODONE HCL 50 MG PO TABS: 50.0000 mg | ORAL_TABLET | Freq: Every day | ORAL | Status: DC

## 2020-12-05 MED ORDER — LOPERAMIDE HCL 2 MG PO CAPS: 2.0000 mg | ORAL_CAPSULE | ORAL | Status: AC | PRN

## 2020-12-05 MED ORDER — CLONAZEPAM 0.5 MG PO TABS: 0.5000 mg | ORAL_TABLET | Freq: Three times a day (TID) | ORAL | Status: DC | PRN

## 2020-12-05 MED ORDER — ALUM & MAG HYDROXIDE-SIMETH 200-200-20 MG/5ML PO SUSP: 30.0000 mL | Freq: Four times a day (QID) | ORAL | Status: DC | PRN

## 2020-12-05 NOTE — Group Note (Signed)
LCSW Group Therapy Note  12/05/2020    10:00-11:00am   Type of Therapy and Topic:  Group Therapy: Early Messages Received About Anger  Participation Level:  Minimal   Description of Group:   In this group, patients shared and discussed the early messages received in their lives about anger through parental or other adult modeling, teaching, repression, punishment, violence, and more.  Participants identified how those childhood lessons influence even now how they usually or often react when angered.  The group discussed that anger is a secondary emotion and what may be the underlying emotional themes that come out through anger outbursts or that are ignored through anger suppression.    Therapeutic Goals: Patients will identify one or more childhood message about anger that they received and how it was taught to them. Patients will discuss how these childhood experiences have influenced and continue to influence their own expression or repression of anger even today. Patients will explore possible primary emotions that tend to fuel their secondary emotion of anger. Patients will learn that anger itself is normal and cannot be eliminated, and that healthier coping skills can assist with resolving conflict rather than worsening situations.  Summary of Patient Progress:  The patient shared that her childhood lessons about anger were from people yelling and getting physical.  As a result, now she yells and is disrespectful when she is angry. She listened the rest of group but did not add to the discussion.    Therapeutic Modalities:   Cognitive Behavioral Therapy Motivation Interviewing  Lynnell Chad  .

## 2020-12-05 NOTE — H&P (Addendum)
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 negative, Covid Negative Ethanol level [id number] Salicylate level<7 EKG -QTC 417 Repeat BMP, order TSH, lipid panel. HbA1C  Psychotherapy: Patient will be encouraged to attend groups.  Medications: See above  Consultations: None  Discharge Concerns: Need stabilization of symptoms  Estimated LOS: 5 to 7 days  Other:     Physician Treatment Plan for Primary Diagnosis: MDD (major depressive disorder), recurrent severe, without psychosis ([place]) Long Term Goal(s): Improvement in symptoms so as ready for discharge  Short Term Goals: Ability to identify  changes in lifestyle to reduce recurrence of condition will improve, Ability to verbalize feelings will improve, Ability to disclose and discuss suicidal ideas, Ability to demonstrate self-control will improve, Ability to identify and develop effective coping behaviors will improve, Ability to maintain clinical measurements within normal limits will improve, Compliance with prescribed medications will improve, and Ability to identify triggers associated with substance abuse/mental health issues will improve  Physician Treatment Plan for Secondary Diagnosis: Principal Problem:   MDD (major depressive disorder), recurrent severe, without psychosis ([place]) Active Problems:   PTSD (post-traumatic stress disorder)   Marijuana abuse   Benzodiazepine abuse ([place])  Long Term Goal(s): Improvement in symptoms so as ready for discharge  Short Term Goals: Ability to identify changes in lifestyle to reduce recurrence of condition will improve, Ability to verbalize feelings will improve, Ability to disclose and discuss suicidal ideas, Ability to demonstrate self-control will improve, Ability to identify and develop effective coping behaviors will improve, Ability to maintain clinical measurements within normal limits will improve, Compliance with prescribed medications will improve, and Ability to identify triggers associated with substance abuse/mental health issues will improve  I certify that inpatient services furnished can reasonably be expected to improve the patient's condition.    Armando Reichert, MD 11/12/20227:06 PM
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
## 2020-12-05 NOTE — Group Note (Signed)
Date:  12/05/2020 Time:  10:53 AM  Number of Participants: 10  Group Focus: daily focus Treatment Modality:  Psychoeducation Interventions utilized were group exercise Purpose: express feelings and increase insight   Participation Level: Pt attended psycho-ed group with RN. 

## 2020-12-05 NOTE — Group Note (Signed)
Date:  12/05/2020 Time:  9:25 AM  Group Topic/Focus:  Goals Group:   The focus of this group is to help patients establish daily goals to achieve during treatment and discuss how the patient can incorporate goal setting into their daily lives to aide in recovery. Orientation:   The focus of this group is to educate the patient on the purpose and policies of crisis stabilization and provide a format to answer questions about their admission.  The group details unit policies and expectations of patients while admitted.    Participation Level:  Active  Participation Quality:  Appropriate and Attentive  Affect:  Appropriate  Cognitive:  Alert and Appropriate  Insight: Appropriate and Good  Engagement in Group:  Engaged  Modes of Intervention:  Discussion  Additional Comments:  Pt has a goal of re-framing her negative thoughts into positive ones. Pt also plans to use reading and writing as a coping skills today.   Angel Carpenter 12/05/2020, 9:25 AM
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
## 2020-12-05 NOTE — [facility] Group Notes (Signed)
1600 - PsychoEducational group- Patients were educated on the power of negative versus positive thinking, and the habits of both and impact on our mental health. Patients were educated on positive re-framing and given examples in day to day life. Pts were given example by showing spaghetti noodles, while together, one thought is not much, over and over becomes more powerful, and harder to break. Patients were then asked to break a noodle and give an example of a negative thought in their life they needed to break free from. Pt participated and was appropriate. 

## 2020-12-06 MED ORDER — CLONAZEPAM 0.5 MG PO TABS
0.5000 mg | ORAL_TABLET | Freq: Two times a day (BID) | ORAL | Status: AC
  Administered 2020-12-06 – 2020-12-07 (×3): 0.5 mg via ORAL
  Filled 2020-12-06 (×3): qty 1

## 2020-12-06 MED ORDER — CLONAZEPAM 0.5 MG PO TABS
0.5000 mg | ORAL_TABLET | Freq: Every day | ORAL | Status: AC
  Administered 2020-12-08 – 2020-12-09 (×2): 0.5 mg via ORAL
  Filled 2020-12-06 (×2): qty 1

## 2020-12-06 NOTE — B[facility] Suicide Risk Assessment (Signed)
[facility] INPATIENT:  Family/Significant Other Suicide Prevention Education  Suicide Prevention Education:  Education Completed;  friend, Rip Harbour [phone],  (name of family member/significant other) has been identified by the patient as the family member/significant other with whom the patient will be residing, and identified as the person(s) who will aid the patient in the event of a mental health crisis (suicidal ideations/suicide attempt).    Friend stated he has done some education on his own about suicide in order to be able to support his friend.  He looks forward to seeing the information that is in her discharge packet also.  With written consent from the patient, the family member/significant other has been provided the following suicide prevention education, prior to the and/or following the discharge of the patient.  The suicide prevention education provided includes the following: Suicide risk factors Suicide prevention and interventions National Suicide Hotline telephone number [facility] assessment telephone number [facility] Emergency Assistance 911 [facility] and/or Residential Mobile Crisis Unit telephone number  Request made of family/significant other to: Remove weapons (e.g., guns, rifles, knives), all items previously/currently identified as safety concern.   Remove drugs/medications (over-the-counter, prescriptions, illicit drugs), all items previously/currently identified as a safety concern.  The family member/significant other verbalizes understanding of the suicide prevention education information provided.  The family member/significant other agrees to remove the items of safety concern listed above.  Carloyn Jaeger Grossman-Orr 12/06/2020, 3:41 PM

## 2020-12-06 NOTE — B[facility] Group Notes (Signed)
Adult Psychoeducational Group Not Date:  12/06/2020 Time:  0900-1045 Group Topic/Focus: PROGRESSIVE RELAXATION. A group where deep breathing is taught and tensing and relaxation muscle groups is used. Imagery is used as well.  Pts are asked to imagine 3 pillars that hold them up when they are not able to hold themselves up.  Participation Level:  Active  Participation Quality:  Appropriate  Affect:  Appropriate  Cognitive:  Oriented  Insight: Improving  Engagement in Group:  Engaged  Modes of Intervention:  Activity, Discussion, Education, and Support  Additional Comments:  rates energy at a 8/10. Her children, her friends and reading hold her up.  Dione Housekeeper

## 2020-12-06 NOTE — B[facility] Group Notes (Signed)
[facility] Group Notes:  (Nursing/MHT/Case Management/Adjunct)  Date:  12/06/2020  Time:  3:21 PM  Type of Therapy:  Group Therapy  Participation Level:  Active  Participation Quality:  Appropriate  Affect:  Appropriate  Cognitive:  Appropriate  Insight:  Good  Engagement in Group:  Engaged  Modes of Intervention:  Discussion  Summary of Progress/Problems: Goal is to work on [name] 12/06/2020, 3:21 PM

## 2020-12-06 NOTE — Group Note (Signed)
B[facility] LCSW Group Therapy Note  12/06/2020   10:00-11:00AM  Type of Therapy and Topic:  Group Therapy:  Unhealthy versus Healthy Supports, Which Am I?  Participation Level:  Active   Description of Group:  Patients in this group were introduced to the concept that additional supports including self-support are an essential part of recovery.  Initially a discussion was held about the differences between healthy versus unhealthy supports.  Patients were asked to share what unhealthy supports in their lives need to be addressed, as well as what additional healthy supports could be added for greater help in reaching their goals.   A song entitled "My Own Hero" was played and a group discussion ensued in which patients stated they could relate to the song and it inspired them to realize they have be willing to help themselves in order to succeed, because other people cannot achieve sobriety or stability for them.  We discussed adding a variety of healthy supports to address the various needs in patient lives, including becoming more self-supportive.  A song was played called "I Know Where I've Been" toward the end of group and used to conduct an inspirational wrap-up to group of remembering how far they have already come in their journey.  Therapeutic Goals: 1)  Highlight the differences between healthy and unhealthy supports 2)  Suggest the importance of being a part of one's own support system 2)  Discuss reasons people in one's life may eventually be unable to be continually supportive  3)  Identify the patient's current support system   4) Elicit commitments to add healthy supports and to become more conscious of being self-supportive   Summary of Patient Progress:  The patient was a vocal member of the group today and talked at length about members of her family, particularly her partner, being unhealthy supports and why she thinks they are unhealthy for her.  She listened attentively to others  sharing and to the two songs played and made appropriate, on topic comments.    Therapeutic Modalities:   Motivational Interviewing Activity  Lynnell Chad , MSW, LCSW
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
## 2020-12-06 NOTE — Plan of Care (Signed)
Pleasant and active in the milieu. Denying SI/HI/AVH. No sign of distress.

## 2020-12-06 NOTE — Progress Notes (Signed)
Adult Psychoeducational Group Note  Date:  12/06/2020 Time:  10:25 PM  Group Topic/Focus:  Wrap-Up Group:   The focus of this group is to help patients review their daily goal of treatment and discuss progress on daily workbooks.  Participation Level:  Active  Participation Quality:  Appropriate  Affect:  Appropriate  Cognitive:  Appropriate  Insight: Appropriate  Engagement in Group:  Engaged  Modes of Intervention:  Discussion  Additional Comments:  Pt stated she met her goal for the day.  Pt stated her goal was to work on [employer], therapeutic.  Tonia Brooms D 12/06/2020, 10:25 PM

## 2020-12-06 NOTE — Progress Notes (Addendum)
[facility] MD Progress Note  12/06/2020 11:02 AM Angel Carpenter  MRN:  [id number] Subjective:  In Short : Angel Carpenter is a 35 year old female with past psychiatry history of MDD without psychosis, history of self-harm behaviors initially presented to [facility] ED for suicidal ideation and worsening depression.  Chart Review from last 24 hours:  The patient's chart was reviewed and nursing notes were reviewed. The patient's case was discussed in multidisciplinary team meeting.  Per [facility] patient has been compliant with her scheduled medication and received as needed Tylenol, melatonin yesterday. Pt slept for 7.5 hours.  Her last CIWA was 0.  Information Obtained Today During Patient Interview:  Pt is seen and examined today. Pt states her mood is "good". Pt rates her depression at 0/10 (10 is the best mood). Pt slept well last night. Pt states her appetite is improving. Pt states that she feels anxious as she recently talked to her daughter and she asked her why she is in the hospital.  She states that she did not tell her daughter and her son about her current situation.  She is asking if she should tell her son about the details about her admission and situation.  She states her son knows little bit about her trauma.  We discussed that if she feels comfortable, she can tell him little bit about her situation when she gets back home. She states her son is also going through depression and his antidepressant were stopped last year.  Recommended that she should ask her son often about how he is feeling and if he is having any suicidal thoughts.  She verbalizes understanding.  She states she has been attending groups and the therapist told her to write 30 positive things about herself.  She states she is having difficulty in thinking 30 positive things about herself.  Currently, Pt denies any suicidal ideation, homicidal ideation and, visual and auditory hallucination.  Patient reports some headache states  that it may be due to weaning Wellbutrin.  She remember the name of medication she was on last year,  was Elavil.  She states it made her very drowsy in the morning.  Pt denies any  nausea, vomiting, dizziness, chest pain, SOB, abdominal pain, diarrhea, and constipation. Pt denies any medication side effects and has been tolerating it well. Pt denies any concerns.     Principal Problem: MDD (major depressive disorder), recurrent severe, without psychosis (HCC) Diagnosis: Principal Problem:   MDD (major depressive disorder), recurrent severe, without psychosis (HCC) Active Problems:   PTSD (post-traumatic stress disorder)   Marijuana abuse   Benzodiazepine abuse (HCC)  Total Time spent with patient: 30 minutes  Past Psychiatric History: see H&P  Past Medical History:  Past Medical History:  Diagnosis Date   Depression    Hypertension     Past Surgical History:  Procedure Laterality Date   INNER EAR SURGERY     Left   NO PAST SURGERIES     Family History:  Family History  Problem Relation Age of Onset   Diabetes Father    Family Psychiatric  History: Father -died due to suicide attempt by overdose.  Patient does not know diagnosis but he was on Seroquel. Son-depression Uncle-died by suicide Social History:  Social History   Substance and Sexual Activity  Alcohol Use No     Social History   Substance and Sexual Activity  Drug Use Yes   Comment: delta gummies    Social History   Socioeconomic  History   Marital status: Married    Spouse name: Not on file   Number of children: Not on file   Years of education: Not on file   Highest education level: Not on file  Occupational History   Not on file  Tobacco Use   Smoking status: Some Days   Smokeless tobacco: Never  Vaping Use   Vaping Use: Never used  Substance and Sexual Activity   Alcohol use: No   Drug use: Yes    Comment: delta gummies   Sexual activity: Yes    Birth control/protection: None  Other  Topics Concern   Not on file  Social History Narrative   Not on file   Social Determinants of Health   Financial Resource Strain: Not on file  Food Insecurity: Not on file  Transportation Needs: Not on file  Physical Activity: Not on file  Stress: Not on file  Social Connections: Not on file   Additional Social History:                         Sleep: Good  Appetite:  Fair improving  Current Medications: Current Facility-Administered Medications  Medication Dose Route Frequency Provider Last Rate Last Admin   acetaminophen (TYLENOL) tablet 650 mg  650 mg Oral Q6H PRN Ethelene Hal, NP   650 mg at 12/05/20 1430   alum & mag hydroxide-simeth (MAALOX/MYLANTA) 200-200-20 MG/5ML suspension 30 mL  30 mL Oral Q6H PRN Armando Reichert, MD       clonazePAM (KLONOPIN) tablet 0.5 mg  0.5 mg Oral BID Nelda Marseille, Cassell Voorhies E, MD   0.5 mg at 12/06/20 0829   escitalopram (LEXAPRO) tablet 10 mg  10 mg Oral Daily Armando Reichert, MD   10 mg at 12/06/20 0830   feeding supplement (BOOST / RESOURCE BREEZE) liquid 1 Container  1 Container Oral BID BM Massengill, [name], MD   1 Container at 12/05/20 1432   hydrOXYzine (ATARAX/VISTARIL) tablet 25 mg  25 mg Oral Q6H PRN Harlow Asa, MD       ibuprofen (ADVIL) tablet 400 mg  400 mg Oral Q6H PRN Ethelene Hal, NP       loperamide (IMODIUM) capsule 2-4 mg  2-4 mg Oral PRN Harlow Asa, MD       LORazepam (ATIVAN) tablet 1 mg  1 mg Oral Q6H PRN Nelda Marseille, Drevin Ortner E, MD       magnesium hydroxide (MILK OF MAGNESIA) suspension 30 mL  30 mL Oral Daily PRN Armando Reichert, MD       melatonin tablet 6 mg  6 mg Oral QHS PRN Bobbitt, [name], NP   6 mg at 12/05/20 2113   ondansetron (ZOFRAN-ODT) disintegrating tablet 4 mg  4 mg Oral Q6H PRN Harlow Asa, MD       traZODone (DESYREL) tablet 50 mg  50 mg Oral QHS PRN Ethelene Hal, NP        Lab Results:  Results for orders placed or performed during the hospital encounter of 12/04/20  (from the past 48 hour(s))  Basic metabolic panel     Status: None   Collection Time: 12/05/20  6:45 PM  Result Value Ref Range   Sodium 137 135 - 145 mmol/L   Potassium 4.1 3.5 - 5.1 mmol/L   Chloride 103 98 - 111 mmol/L   CO2 27 22 - 32 mmol/L   Glucose, Bld 81 70 - 99 mg/dL    Comment:  Glucose reference range applies only to samples taken after fasting for at least 8 hours.   BUN 13 6 - 20 mg/dL   Creatinine, Ser 0.76 0.44 - 1.00 mg/dL   Calcium 9.3 8.9 - 10.3 mg/dL   GFR, Estimated >60 >60 mL/min    Comment: (NOTE) Calculated using the CKD-EPI Creatinine Equation (2021)    Anion gap 7 5 - 15    Comment: Performed at [facility], [place] [address], [place], [place] [address]  Lipid panel     Status: None   Collection Time: 12/05/20  6:45 PM  Result Value Ref Range   Cholesterol 166 0 - 200 mg/dL   Triglycerides 80 <150 mg/dL   HDL 53 >40 mg/dL   Total CHOL/HDL Ratio 3.1 RATIO   VLDL 16 0 - 40 mg/dL   LDL Cholesterol 97 0 - 99 mg/dL    Comment:        Total Cholesterol/HDL:CHD Risk Coronary Heart Disease Risk Table                     Men   Women  1/2 Average Risk   3.4   3.3  Average Risk       5.0   4.4  2 X Average Risk   9.6   7.1  3 X Average Risk  23.4   11.0        Use the calculated Patient Ratio above and the CHD Risk Table to determine the patient's CHD Risk.        ATP III CLASSIFICATION (LDL):  <100     mg/dL   Optimal  100-129  mg/dL   Near or Above                    Optimal  130-159  mg/dL   Borderline  160-189  mg/dL   High  >190     mg/dL   Very High Performed at [facility] [address] [place], [place] [address]   TSH     Status: Abnormal   Collection Time: 12/05/20  6:45 PM  Result Value Ref Range   TSH 0.312 (L) 0.350 - 4.500 uIU/mL    Comment: Performed by a 3rd Generation assay with a functional sensitivity of <=0.01 uIU/mL. Performed at [facility], [place] [address],  [place], [place] [address]   Hemoglobin A1c     Status: Abnormal   Collection Time: 12/05/20  6:45 PM  Result Value Ref Range   Hgb A1c MFr Bld 4.7 (L) 4.8 - 5.6 %    Comment: (NOTE) Pre diabetes:          5.7%-6.4%  Diabetes:              >6.4%  Glycemic control for   <7.0% adults with diabetes    Mean Plasma Glucose 88.19 mg/dL    Comment: Performed at [facility] [address], [place], [place] [address]    Blood Alcohol level:  Lab Results  Component Value Date   [facility] <10 0000000    Metabolic Disorder Labs: Lab Results  Component Value Date   HGBA1C 4.7 (L) 12/05/2020   MPG 88.19 12/05/2020   No results found for: PROLACTIN Lab Results  Component Value Date   CHOL 166 12/05/2020   TRIG 80 12/05/2020   HDL 53 12/05/2020   CHOLHDL 3.1 12/05/2020   VLDL 16 12/05/2020   LDLCALC 97 12/05/2020  Physical Findings: AIMS: Facial and Oral Movements Muscles of Facial Expression: None, normal Lips and Perioral Area: None, normal Jaw: None, normal Tongue: None, normal,Extremity Movements Upper (arms, wrists, hands, fingers): None, normal Lower (legs, knees, ankles, toes): None, normal, Trunk Movements Neck, shoulders, hips: None, normal, Overall Severity Severity of abnormal movements (highest score from questions above): None, normal Incapacitation due to abnormal movements: None, normal Patient's awareness of abnormal movements (rate only patient's report): No Awareness, Dental Status Current problems with teeth and/or dentures?: No Does patient usually wear dentures?: No  CIWA:  CIWA-Ar Total: 0 COWS:     Musculoskeletal: Strength & Muscle Tone: within normal limits Gait & Station: normal Patient leans: N/A  Psychiatric Specialty Exam:  Presentation  General Appearance: Fairly Groomed  Eye Contact:Good  Speech:Clear and Coherent  Speech Volume:Normal  Handedness:Right   Mood and Affect  Mood:Anxious;  Dysphoric  Affect:Appropriate   Thought Process  Thought Processes:Coherent; Goal Directed  Descriptions of Associations:Intact  Orientation:Full (Time, Place and Person)  Thought Content:WDL - denies AVH, paranoia or delusions  History of Schizophrenia/Schizoaffective disorder:No  Duration of Psychotic Symptoms:No data recorded Hallucinations:Hallucinations: None  Ideas of Reference:None  Suicidal Thoughts:Suicidal Thoughts: No  Homicidal Thoughts:Homicidal Thoughts: No   Sensorium  Memory:Immediate Good; Recent Good  Judgment:Fair  Insight:Good   Executive Functions  Concentration:Fair  Attention Span:Good  [place] of Knowledge:Good  Language:Good   Psychomotor Activity  Psychomotor Activity:Psychomotor Activity: Normal   Assets  Assets:Communication Skills; Desire for Improvement; Financial Resources/Insurance; Resilience; Talents/Skills   Sleep  Sleep:Sleep: Good Number of Hours of Sleep: 7.5    Physical Exam: Physical Exam Vitals and nursing note reviewed.  Constitutional:      General: She is not in acute distress.    Appearance: Normal appearance. She is not ill-appearing, toxic-appearing or diaphoretic.  HENT:     Head: Normocephalic.  Pulmonary:     Effort: Pulmonary effort is normal.  Neurological:     General: No focal deficit present.     Mental Status: She is alert and oriented to person, place, and time.   Review of Systems  Constitutional:  Negative for chills and fever.  HENT:  Negative for hearing loss.   Eyes:  Negative for blurred vision.  Respiratory:  Negative for cough and shortness of breath.   Cardiovascular:  Negative for chest pain.  Gastrointestinal:  Negative for abdominal pain, nausea and vomiting.  Neurological:  Positive for headaches. Negative for dizziness.  Psychiatric/Behavioral:  Negative for depression, hallucinations and suicidal ideas. The patient is nervous/anxious. The patient does not  have insomnia.   Blood pressure 105/74, pulse 85, temperature 97.7 F (36.5 C), temperature source Oral, resp. rate 18, height 5\' 8"  (1.727 m), weight 69.4 kg, last menstrual period 11/30/2020, SpO2 99 %. Body mass index is 23.26 kg/m.   Treatment Plan Summary: Shar Emde is a 35 year old female with past psychiatry history of MDD without psychosis and history of self-harm behaviors, who initially presented to [facility] ED for suicidal ideation and worsening depression.Daily contact with patient to assess and evaluate symptoms and progress in treatment   Labs reviewed  CBC within normal limits CMP-sodium 139, potassium 3.4, glucose 97, AST 17, ALT 15, normal renal function UDS-positive for benzodiazepine and THC Preg test -negative Respiratory panel -Influenza A and B negative, Covid Negative Ethanol level [id number] Salicylate level<7 EKG -QTC 417 Repeat BMP on 11/12 shows sodium 137, potassium 4.1, TSH low at 0.312, lipid panel shows cholesterol 166, LDL 97,  triglyceride 80. HbA1C-4.7    Plan  -Monitor Vitals. -Monitor for Suicidal Ideation. -Monitor for withdrawal symptoms. -Monitor for medication side effects.   MDD, recurrent, severe without psychosis (r/o bipolar affective, r/o substance-induced mood disorder) PTSD Cluster B traits R/o ADD -Wellbutrin stopped.  -Continue Lexapro 10 mg daily  -Continue melatonin 6 mg at bedtime as needed -Patient filled mood disorder questionnaire with 3 yes answers for question 1.  Yes for question 2 and minor problem for question 3. Pt doesn't meet criteria for Bipolar disorder based on this screening questionnaire.   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  Encouraged to discontinue. -Continue Klonopin 0.5 mg 2 times daily today and tomorrow then 0.5mg  daily for 2 days then stop Verified at [facility].  Last filled 90 tablets on 10/18/2020.    -Continue CIWA for benzodiazepine withdrawal monitoring. (Recent CIWA 0, 2)  R/o hyperthyroidism (TSH low at 0.312) - Checking FT4 and FT3  Hypokalemia - resolved - Repeat BMP shows K+ 4.1 on 11/12   PRN's  -Continue Tylenol 650 mg every 6 hours as needed for pain or fever -Continue Milk of Magnesia 30 ml PRN Daily for Constipation. -Continue Maalox/Mylanta 30 ml Q4H PRN for Indigestion. -Continue Hydroxyzine 25 mg TID PRN for Anxiety. -Continue Trazodone 50 mg QHS PRN for sleep.    Discharge Planning: Social work and case management to assist with discharge planning and identification of hospital follow-up needs prior to discharge Estimated LOS: 5-7 days Discharge Concerns: Need to establish a safety plan; Medication compliance and effectiveness Discharge Goals: Return home with outpatient referrals for mental health follow-up including medication management/psychotherapy.  Armando Reichert, MD 12/06/2020, 11:02 AM

## 2020-12-07 ENCOUNTER — Encounter (HOSPITAL_COMMUNITY): Payer: Self-pay

## 2020-12-07 ENCOUNTER — Telehealth (HOSPITAL_COMMUNITY): Payer: Self-pay | Admitting: Family Medicine

## 2020-12-07 LAB — T4, FREE: Free T4: 0.96 ng/dL (ref 0.61–1.12)

## 2020-12-07 MED ORDER — ENSURE ENLIVE PO LIQD: 237.0000 mL | ORAL | Status: DC

## 2020-12-07 MED ORDER — BOOST / RESOURCE BREEZE PO LIQD CUSTOM
1.0000 | Freq: Two times a day (BID) | ORAL | Status: DC
  Administered 2020-12-07: 1 via ORAL
  Filled 2020-12-07 (×4): qty 1

## 2020-12-07 NOTE — Progress Notes (Signed)
Adult Psychoeducational Group Note  Date:  12/07/2020 Time:  9:07 PM  Group Topic/Focus:  Wrap-Up Group:   The focus of this group is to help patients review their daily goal of treatment and discuss progress on daily workbooks.  Participation Level:  Active  Participation Quality:  Appropriate  Affect:  Appropriate  Cognitive:  Appropriate  Insight: Appropriate  Engagement in Group:  Engaged  Modes of Intervention:  Discussion  Additional Comments:  patient said her day was a 10. Her goal for today using coping skills. She did achieved her goal. Coping skills beathing treatments and journaling.  Charna Busman Long 12/07/2020, 9:07 PM

## 2020-12-07 NOTE — Progress Notes (Signed)
   12/06/20 2115  Psych Admission Type (Psych Patients Only)  Admission Status Voluntary  Psychosocial Assessment  Patient Complaints None  Eye Contact Fair  Facial Expression Animated  Affect Appropriate to circumstance  Speech Logical/coherent  Interaction Assertive  Motor Activity Other (Comment) (WDL)  Appearance/Hygiene Unremarkable  Behavior Characteristics Cooperative;Appropriate to situation  Mood Pleasant  Thought Process  Coherency WDL  Content WDL  Delusions None reported or observed  Perception WDL  Hallucination None reported or observed  Judgment WDL  Confusion None  Danger to Self  Current suicidal ideation? Denies  Danger to Others  Danger to Others None reported or observed

## 2020-12-07 NOTE — BH IP Treatment Plan (Signed)
Interdisciplinary Treatment and Diagnostic Plan Update  12/07/2020 Time of Session: 10:40am Angel Carpenter MRN: [id number]  Principal Diagnosis: MDD (major depressive disorder), recurrent severe, without psychosis (Angel Carpenter)  Secondary Diagnoses: Principal Problem:   MDD (major depressive disorder), recurrent severe, without psychosis (Angel Carpenter) Active Problems:   PTSD (post-traumatic stress disorder)   Marijuana abuse   Benzodiazepine abuse ([place])   Current Medications:  Current Facility-Administered Medications  Medication Dose Route Frequency Provider Last Rate Last Admin   acetaminophen (TYLENOL) tablet 650 mg  650 mg Oral Q6H PRN Ethelene Hal, NP   650 mg at 12/06/20 1112   alum & mag hydroxide-simeth (MAALOX/MYLANTA) 200-200-20 MG/5ML suspension 30 mL  30 mL Oral Q6H PRN Armando Reichert, MD       clonazePAM (KLONOPIN) tablet 0.5 mg  0.5 mg Oral BID Nelda Marseille, Amy E, MD   0.5 mg at 12/07/20 0811   [START ON 12/08/2020] clonazePAM (KLONOPIN) tablet 0.5 mg  0.5 mg Oral Daily Nelda Marseille, Amy E, MD       escitalopram (LEXAPRO) tablet 10 mg  10 mg Oral Daily Armando Reichert, MD   10 mg at 12/07/20 5038   feeding supplement (BOOST / RESOURCE BREEZE) liquid 1 Container  1 Container Oral BID BM Massengill, [name], MD       hydrOXYzine (ATARAX/VISTARIL) tablet 25 mg  25 mg Oral Q6H PRN Harlow Asa, MD   25 mg at 12/06/20 2115   ibuprofen (ADVIL) tablet 400 mg  400 mg Oral Q6H PRN Ethelene Hal, NP       loperamide (IMODIUM) capsule 2-4 mg  2-4 mg Oral PRN Harlow Asa, MD       LORazepam (ATIVAN) tablet 1 mg  1 mg Oral Q6H PRN Nelda Marseille, Amy E, MD       magnesium hydroxide (MILK OF MAGNESIA) suspension 30 mL  30 mL Oral Daily PRN Armando Reichert, MD       melatonin tablet 6 mg  6 mg Oral QHS PRN Bobbitt, [name], NP   6 mg at 12/06/20 2115   ondansetron (ZOFRAN-ODT) disintegrating tablet 4 mg  4 mg Oral Q6H PRN Harlow Asa, MD       traZODone (DESYREL) tablet 50 mg  50 mg Oral  QHS PRN Ethelene Hal, NP       PTA Medications: Medications Prior to Admission  Medication Sig Dispense Refill Last Dose   buPROPion (WELLBUTRIN XL) 300 MG 24 hr tablet Take 300 mg by mouth daily.      clonazePAM (KLONOPIN) 0.5 MG tablet Take 0.5 mg by mouth 3 (three) times daily as needed.       Patient Stressors:    Patient Strengths:    Treatment Modalities: Medication Management, Group therapy, Case management,  1 to 1 session with clinician, Psychoeducation, Recreational therapy.   Physician Treatment Plan for Primary Diagnosis: MDD (major depressive disorder), recurrent severe, without psychosis (Angel Carpenter) Long Term Goal(s): Improvement in symptoms so as ready for discharge   Short Term Goals: Ability to identify changes in lifestyle to reduce recurrence of condition will improve Ability to verbalize feelings will improve Ability to disclose and discuss suicidal ideas Ability to demonstrate self-control will improve Ability to identify and develop effective coping behaviors will improve Ability to maintain clinical measurements within normal limits will improve Compliance with prescribed medications will improve Ability to identify triggers associated with substance abuse/mental health issues will improve  Medication Management: Evaluate patient's response, side effects, and tolerance of medication regimen.  Therapeutic  Interventions: 1 to 1 sessions, Unit Group sessions and Medication administration.  Evaluation of Outcomes: Not Met  Physician Treatment Plan for Secondary Diagnosis: Principal Problem:   MDD (major depressive disorder), recurrent severe, without psychosis (Angel Carpenter) Active Problems:   PTSD (post-traumatic stress disorder)   Marijuana abuse   Benzodiazepine abuse ([name])  Long Term Goal(s): Improvement in symptoms so as ready for discharge   Short Term Goals: Ability to identify changes in lifestyle to reduce recurrence of condition will improve Ability  to verbalize feelings will improve Ability to disclose and discuss suicidal ideas Ability to demonstrate self-control will improve Ability to identify and develop effective coping behaviors will improve Ability to maintain clinical measurements within normal limits will improve Compliance with prescribed medications will improve Ability to identify triggers associated with substance abuse/mental health issues will improve     Medication Management: Evaluate patient's response, side effects, and tolerance of medication regimen.  Therapeutic Interventions: 1 to 1 sessions, Unit Group sessions and Medication administration.  Evaluation of Outcomes: Not Met   RN Treatment Plan for Primary Diagnosis: MDD (major depressive disorder), recurrent severe, without psychosis (Angel Carpenter) Long Term Goal(s): Knowledge of disease and therapeutic regimen to maintain health will improve  Short Term Goals: Ability to remain free from injury will improve, Ability to verbalize frustration and anger appropriately will improve, Ability to demonstrate self-control, Ability to identify and develop effective coping behaviors will improve, and Compliance with prescribed medications will improve  Medication Management: RN will administer medications as ordered by provider, will assess and evaluate patient's response and provide education to patient for prescribed medication. RN will report any adverse and/or side effects to prescribing provider.  Therapeutic Interventions: 1 on 1 counseling sessions, Psychoeducation, Medication administration, Evaluate responses to treatment, Monitor vital signs and CBGs as ordered, Perform/monitor CIWA, COWS, AIMS and Fall Risk screenings as ordered, Perform wound care treatments as ordered.  Evaluation of Outcomes: Not Met   LCSW Treatment Plan for Primary Diagnosis: MDD (major depressive disorder), recurrent severe, without psychosis (Angel Carpenter) Long Term Goal(s): Safe transition to  appropriate next level of care at discharge, Engage patient in therapeutic group addressing interpersonal concerns.  Short Term Goals: Engage patient in aftercare planning with referrals and resources, Increase social support, Increase ability to appropriately verbalize feelings, Increase emotional regulation, Facilitate patient progression through stages of change regarding substance use diagnoses and concerns, Identify triggers associated with mental health/substance abuse issues, and Increase skills for wellness and recovery  Therapeutic Interventions: Assess for all discharge needs, 1 to 1 time with Social worker, Explore available resources and support systems, Assess for adequacy in community support network, Educate family and significant other(s) on suicide prevention, Complete Psychosocial Assessment, Interpersonal group therapy.  Evaluation of Outcomes: Not Met   Progress in Treatment: Attending groups: Yes. Participating in groups: Yes. Taking medication as prescribed: Yes. Toleration medication: Yes. Family/Significant other contact made: Yes, individual(s) contacted:  with friend Patient understands diagnosis: Yes. Discussing patient identified problems/goals with staff: Yes. Medical problems stabilized or resolved: Yes. Denies suicidal/homicidal ideation: Yes. Issues/concerns per patient self-inventory: No.   New problem(s) identified: No, Describe:  none  New Short Term/Long Term Goal(s): medication stabilization, elimination of SI thoughts, development of comprehensive mental wellness plan.    Patient Goals:  "Coping skills for difficult situations"  Discharge Plan or Barriers: Patient recently admitted. CSW will continue to follow and assess for appropriate referrals and possible discharge planning.    Reason for Continuation of Hospitalization: Anxiety Depression Medication stabilization  Estimated  Length of Stay: 3-5 days   Scribe for Treatment  Team: Vassie Moselle, LCSW 12/07/2020 11:21 AM

## 2020-12-07 NOTE — Progress Notes (Signed)
NUTRITION ASSESSMENT RD working remotely.  Pt identified as at risk on the Malnutrition Screen Tool  INTERVENTION: - continue Boost Breeze BID, each supplement provides 250 kcal and 9 grams of protein. - will order Ensure Enlive one/day, each supplement provides 350 kcal and 20 grams of protein.  NUTRITION DIAGNOSIS: Unintentional weight loss related to sub-optimal intake as evidenced by pt report.   Goal: Pt to meet >/= 90% of their estimated nutrition needs.  Monitor:  PO intake  Assessment:  Patient with hx of MDD, self-harm, marijuana abuse, benzodiazepine abuse, and PTSD. She was admitted for severe MDD without psychosis and SI.   Weight on 11/11 was 153 lb and PTA the most recently documented weight was 197 lb on 08/10/19. This indicates 44 lb weight loss (22% body weight) in the past 16 months. Unsure if weight loss occurred more acutely.   Boost Breeze ordered BID at the time of admission and she accepted all but 1 bottle of supplement offered to her.   35 y.o. female  Height: Ht Readings from Last 1 Encounters:  12/04/20 5\' 8"  (1.727 m)    Weight: Wt Readings from Last 1 Encounters:  12/04/20 69.4 kg    Weight Hx: Wt Readings from Last 10 Encounters:  12/04/20 69.4 kg  12/03/20 69.4 kg  08/10/19 89.4 kg  08/27/17 88 kg  01/18/17 82.6 kg  05/02/13 90.3 kg  02/25/13 80.7 kg  12/13/11 75.8 kg  09/14/11 74.8 kg    BMI:  Body mass index is 23.26 kg/m. Pt meets criteria for normal weight based on current BMI.  Estimated Nutritional Needs: Kcal: 25-30 kcal/kg Protein: > 1 gram protein/kg Fluid: 1 ml/kcal  Diet Order:  Diet Order             Diet regular Room service appropriate? Yes; Fluid consistency: Thin  Diet effective now                  Pt is also offered choice of unit snacks mid-morning and mid-afternoon.  Pt is eating as desired.   Lab results and medications reviewed.      09/16/11, MS, RD, LDN, CNSC Inpatient Clinical  Dietitian RD pager # available in [facility]  After hours/weekend pager # available in [facility]

## 2020-12-07 NOTE — Group Note (Signed)
Occupational Therapy Group Note  Group Topic:Feelings Management  Group Date: 12/07/2020 Start Time: 1400 End Time: 1500 Facilitators: Donne Hazel, OT/L    Group Description: Group encouraged increased engagement and participation through discussion focused on Building Happiness. Patients were provided a handout and reviewed therapeutic strategies to build happiness including identifying gratitudes, random acts of kindness, exercise, meditation, positive journaling, and fostering relationships. Patients engaged in discussion and encouraged to reflect on each strategy and their experiences.  Therapeutic Goal(s): Identify strategies to build happiness. Identify and implement therapeutic strategies to improve overall mood. Practice and identify gratitudes, random acts of kindness, exercise, meditation, positive journaling, and fostering relationships     Participation Level: Active   Participation Quality: Independent   Behavior: Cooperative and Interactive    Speech/Thought Process: Focused   Affect/Mood: Full range   Insight: Fair   Judgement: Fair   Individualization: Mertis was active in their participation of group discussion/activity. Pt identified "my kids" as something that brings them happiness. Appeared attentive and engaged in discussion.   Modes of Intervention: Activity, Discussion, and Education  Patient Response to Interventions:  Attentive, Engaged, and Receptive   Plan: Continue to engage patient in OT groups 2 - 3x/week.  12/07/2020  Donne Hazel, OT/L

## 2020-12-07 NOTE — BHH Group Notes (Signed)
Pt attended goals group and contributed 

## 2020-12-07 NOTE — BH Assessment (Signed)
Care Management - Follow Up [facility] Discharges   Patient has been placed in an inpatient psychiatric hospital (([facility]).

## 2020-12-07 NOTE — Progress Notes (Addendum)
[facility] MD Progress Note  12/07/2020 11:50 AM Angel Carpenter  MRN:  [id number] Subjective:  In Short : Angel Carpenter is a 35 year old female with past psychiatry history of MDD without psychosis, history of self-harm behaviors initially presented to [facility] ED for suicidal ideation and worsening depression.  Chart Review from last 24 hours:  The patient's chart was reviewed and nursing notes were reviewed. The patient's case was discussed in multidisciplinary team meeting.  Per [facility] patient has been compliant with her scheduled medication and received as needed Tylenol, Vistaril and melatonin yesterday.  Her last CIWA was 0.  Information Obtained Today During Patient Interview:  Pt is seen and examined today. Pt states her mood is "good".  She reports improvement in her depression and anxiety. She states her mood is stable and she does not feel hyperactive.  Pt slept well last night. Pt states her appetite is good. Pt reports some anxiety states she took Vistaril yesterday for anxiety which helped her.  She states she had some headache yesterday but does not have any headache today.  She states that she talked to her daughter again but did not tell her daughter or her son about her current situation. Currently, Pt denies any suicidal ideation, homicidal ideation and, visual and auditory hallucination.  Discussed results for low TSH and told her that we ordered T3 and T4 and she would need to follow-up with PCP to repeat these tests in 4 weeks.  Patient verbalized understanding.  Pt denies any  nausea, vomiting, dizziness, chest pain, SOB, abdominal pain, diarrhea, and constipation. Pt denies any medication side effects and has been tolerating it well. Pt denies any concerns.     Principal Problem: MDD (major depressive disorder), recurrent severe, without psychosis ([place]) Diagnosis: Principal Problem:   MDD (major depressive disorder), recurrent severe, without psychosis ([place]) Active Problems:    PTSD (post-traumatic stress disorder)   Marijuana abuse   Benzodiazepine abuse ([place])  Total Time spent with patient: 30 minutes  Past Psychiatric History: see H&P  Past Medical History:  Past Medical History:  Diagnosis Date   Depression    Hypertension     Past Surgical History:  Procedure Laterality Date   INNER EAR SURGERY     Left   NO PAST SURGERIES     Family History:  Family History  Problem Relation Age of Onset   Diabetes Father    Family Psychiatric  History: Father -died due to suicide attempt by overdose.  Patient does not know diagnosis but he was on Seroquel. Son-depression Uncle-died by suicide Social History:  Social History   Substance and Sexual Activity  Alcohol Use No     Social History   Substance and Sexual Activity  Drug Use Yes   Comment: delta gummies    Social History   Socioeconomic History   Marital status: Married    Spouse name: Not on file   Number of children: Not on file   Years of education: Not on file   Highest education level: Not on file  Occupational History   Not on file  Tobacco Use   Smoking status: Some Days   Smokeless tobacco: Never  Vaping Use   Vaping Use: Never used  Substance and Sexual Activity   Alcohol use: No   Drug use: Yes    Comment: delta gummies   Sexual activity: Yes    Birth control/protection: None  Other Topics Concern   Not on file  Social History Narrative  Not on file   Social Determinants of Health   Financial Resource Strain: Not on file  Food Insecurity: Not on file  Transportation Needs: Not on file  Physical Activity: Not on file  Stress: Not on file  Social Connections: Not on file   Additional Social History:                         Sleep: Good  Appetite:  Good improved.  Current Medications: Current Facility-Administered Medications  Medication Dose Route Frequency Provider Last Rate Last Admin   acetaminophen (TYLENOL) tablet 650 mg  650 mg Oral Q6H  PRN Laveda Abbe, NP   650 mg at 12/06/20 1112   alum & mag hydroxide-simeth (MAALOX/MYLANTA) 200-200-20 MG/5ML suspension 30 mL  30 mL Oral Q6H PRN Karsten Ro, MD       clonazePAM (KLONOPIN) tablet 0.5 mg  0.5 mg Oral BID Mason Jim, Shawntavia Saunders E, MD   0.5 mg at 12/07/20 0811   [START ON 12/08/2020] clonazePAM (KLONOPIN) tablet 0.5 mg  0.5 mg Oral Daily Comer Locket, MD       escitalopram (LEXAPRO) tablet 10 mg  10 mg Oral Daily Karsten Ro, MD   10 mg at 12/07/20 2694   feeding supplement (BOOST / RESOURCE BREEZE) liquid 1 Container  1 Container Oral BID BM Massengill, Harrold Donath, MD       hydrOXYzine (ATARAX/VISTARIL) tablet 25 mg  25 mg Oral Q6H PRN Comer Locket, MD   25 mg at 12/06/20 2115   ibuprofen (ADVIL) tablet 400 mg  400 mg Oral Q6H PRN Laveda Abbe, NP       loperamide (IMODIUM) capsule 2-4 mg  2-4 mg Oral PRN Comer Locket, MD       LORazepam (ATIVAN) tablet 1 mg  1 mg Oral Q6H PRN Comer Locket, MD       magnesium hydroxide (MILK OF MAGNESIA) suspension 30 mL  30 mL Oral Daily PRN Karsten Ro, MD       melatonin tablet 6 mg  6 mg Oral QHS PRN Bobbitt, [name], NP   6 mg at 12/06/20 2115   ondansetron (ZOFRAN-ODT) disintegrating tablet 4 mg  4 mg Oral Q6H PRN Comer Locket, MD       traZODone (DESYREL) tablet 50 mg  50 mg Oral QHS PRN Laveda Abbe, NP        Lab Results:  Results for orders placed or performed during the hospital encounter of 12/04/20 (from the past 48 hour(s))  Basic metabolic panel     Status: None   Collection Time: 12/05/20  6:45 PM  Result Value Ref Range   Sodium 137 135 - 145 mmol/L   Potassium 4.1 3.5 - 5.1 mmol/L   Chloride 103 98 - 111 mmol/L   CO2 27 22 - 32 mmol/L   Glucose, Bld 81 70 - 99 mg/dL    Comment: Glucose reference range applies only to samples taken after fasting for at least 8 hours.   BUN 13 6 - 20 mg/dL   Creatinine, Ser 8.54 0.44 - 1.00 mg/dL   Calcium 9.3 8.9 - 62.7 mg/dL   GFR, Estimated  >03 >50 mL/min    Comment: (NOTE) Calculated using the CKD-EPI Creatinine Equation (2021)    Anion gap 7 5 - 15    Comment: Performed at [facility], [address], [place], [place] [address]  Lipid panel     Status:  None   Collection Time: 12/05/20  6:45 PM  Result Value Ref Range   Cholesterol 166 0 - 200 mg/dL   Triglycerides 80 <277 mg/dL   HDL 53 >82 mg/dL   Total CHOL/HDL Ratio 3.1 RATIO   VLDL 16 0 - 40 mg/dL   LDL Cholesterol 97 0 - 99 mg/dL    Comment:        Total Cholesterol/HDL:CHD Risk Coronary Heart Disease Risk Table                     Men   Women  1/2 Average Risk   3.4   3.3  Average Risk       5.0   4.4  2 X Average Risk   9.6   7.1  3 X Average Risk  23.4   11.0        Use the calculated Patient Ratio above and the CHD Risk Table to determine the patient's CHD Risk.        ATP III CLASSIFICATION (LDL):  <100     mg/dL   Optimal  423-536  mg/dL   Near or Above                    Optimal  130-159  mg/dL   Borderline  144-315  mg/dL   High  >400     mg/dL   Very High Performed at [facility], [address], [place], [place] [address]   TSH     Status: Abnormal   Collection Time: 12/05/20  6:45 PM  Result Value Ref Range   TSH 0.312 (L) 0.350 - 4.500 uIU/mL    Comment: Performed by a 3rd Generation assay with a functional sensitivity of <=0.01 uIU/mL. Performed at [facility], [address], [place], [place] [address]   Hemoglobin A1c     Status: Abnormal   Collection Time: 12/05/20  6:45 PM  Result Value Ref Range   Hgb A1c MFr Bld 4.7 (L) 4.8 - 5.6 %    Comment: (NOTE) Pre diabetes:          5.7%-6.4%  Diabetes:              >6.4%  Glycemic control for   <7.0% adults with diabetes    Mean Plasma Glucose 88.19 mg/dL    Comment: Performed at [facility], [address], [place], [place] [address]  T4, free     Status: None   Collection Time: 12/06/20  6:47 PM  Result Value  Ref Range   Free T4 0.96 0.61 - 1.12 ng/dL    Comment: (NOTE) Biotin ingestion may interfere with free T4 tests. If the results are inconsistent with the TSH level, previous test results, or the clinical presentation, then consider biotin interference. If needed, order repeat testing after stopping biotin. Performed at [facility], [address] [place], [place] [address]     Blood Alcohol level:  Lab Results  Component Value Date   ETH <10 12/03/2020    Metabolic Disorder Labs: Lab Results  Component Value Date   HGBA1C 4.7 (L) 12/05/2020   MPG 88.19 12/05/2020   No results found for: PROLACTIN Lab Results  Component Value Date   CHOL 166 12/05/2020   TRIG 80 12/05/2020   HDL 53 12/05/2020   CHOLHDL 3.1 12/05/2020   VLDL 16 12/05/2020   LDLCALC 97 12/05/2020    Physical Findings: AIMS: Facial and  Oral Movements Muscles of Facial Expression: None, normal Lips and Perioral Area: None, normal Jaw: None, normal Tongue: None, normal,Extremity Movements Upper (arms, wrists, hands, fingers): None, normal Lower (legs, knees, ankles, toes): None, normal, Trunk Movements Neck, shoulders, hips: None, normal, Overall Severity Severity of abnormal movements (highest score from questions above): None, normal Incapacitation due to abnormal movements: None, normal Patient's awareness of abnormal movements (rate only patient's report): No Awareness, Dental Status Current problems with teeth and/or dentures?: No Does patient usually wear dentures?: No  CIWA:  CIWA-Ar Total: 0 COWS:     Musculoskeletal: Strength & Muscle Tone: within normal limits Gait & Station: normal Patient leans: N/A  Psychiatric Specialty Exam:  Presentation  General Appearance: Fairly Groomed  Eye Contact:Good  Speech:Clear and Coherent  Speech Volume:Normal  Handedness:Right   Mood and Affect  Mood:anxious  Affect:congruent   Thought Process  Thought Processes:Coherent;  Goal Directed  Descriptions of Associations:Intact  Orientation:Full (Time, Place and Person)  Thought Content:WDL  - denies AVH, paranoia or delusions  History of Schizophrenia/Schizoaffective disorder:No  Duration of Psychotic Symptoms:No data recorded  Hallucinations:Hallucinations: None  Ideas of Reference:None  Suicidal Thoughts:Suicidal Thoughts: No  Homicidal Thoughts:Homicidal Thoughts: No   Sensorium  Memory:Immediate Good; Recent Good  Judgment:Fair  Insight:Improving    Executive Functions  Concentration:Good  Attention Span:Good  [place] of Knowledge:Good  Language:Good   Psychomotor Activity  Psychomotor Activity:Psychomotor Activity: Normal   Assets  Assets:Communication Skills; Desire for Improvement; Financial Resources/Insurance; Talents/Skills   Sleep  Sleep:time unrecorded  Physical Exam Vitals and nursing note reviewed.  Constitutional:      General: She is not in acute distress.    Appearance: Normal appearance. She is not ill-appearing, toxic-appearing or diaphoretic.  HENT:     Head: Normocephalic.  Pulmonary:     Effort: Pulmonary effort is normal.  Neurological:     General: No focal deficit present.     Mental Status: She is alert and oriented to person, place, and time.   Review of Systems  Respiratory:  Negative for shortness of breath.   Cardiovascular:  Negative for chest pain.  Gastrointestinal:  Negative for nausea and vomiting.  Neurological:  Negative for headaches.  Blood pressure 105/85, pulse 85, temperature (!) 97.5 F (36.4 C), temperature source Oral, resp. rate 18, height 5\' 8"  (1.727 m), weight 69.4 kg, last menstrual period 11/30/2020, SpO2 99 %. Body mass index is 23.26 kg/m.   Treatment Plan Summary: Maiyah Lubas is a 35 year old female with past psychiatry history of MDD without psychosis and history of self-harm behaviors, who initially presented to [facility] ED for  suicidal ideation and worsening depression. Daily contact with patient to assess and evaluate symptoms and progress in treatment   Labs reviewed  CBC within normal limits CMP-sodium 139, potassium 3.4, glucose 97, AST 17, ALT 15, normal renal function UDS-positive for benzodiazepine and THC Preg test -negative Respiratory panel -Influenza A and B negative, Covid Negative Ethanol level [id number] Salicylate level<7 EKG -QTC 417 Repeat BMP on 11/12 shows sodium 137, potassium 4.1,lipid panel shows cholesterol 166, LDL 97, triglyceride 80. HbA1C-4.7  TSH - 0.312, T4- 0.96, T3 pending.    Plan  -Monitor Vitals. -Monitor for Suicidal Ideation. -Monitor for withdrawal symptoms. -Monitor for medication side effects.   MDD, recurrent, severe without psychosis (r/o substance-induced mood disorder) PTSD Cluster B traits R/o ADD -Wellbutrin stopped.  -Continue Lexapro 10 mg daily  -Continue melatonin 6 mg at bedtime as needed -Patient filled mood  disorder questionnaire with 3 yes answers for question 1.  Yes for question 2 and minor problem for question 3. Pt doesn't meet criteria for Bipolar disorder based on this screening questionnaire.   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  Encouraged to discontinue. -Continue Klonopin 0.5 mg 2 times daily today  then 0.5mg  daily for 2 days then stop Verified at [facility].  Last filled 90 tablets on 10/18/2020.   -Continue CIWA for benzodiazepine withdrawal monitoring. (Recent CIWA 0, 2)  R/o hyperthyroidism (TSH low at 0.312) - T4- 0.96, T3 pending.  -Need follow up with PCP to repeat testing in 4 weeks.   Hypokalemia - resolved - Repeat BMP shows K+ 4.1 on 11/12   PRN's  -Continue Tylenol 650 mg every 6 hours as needed for pain or fever -Continue Milk of Magnesia 30 ml PRN Daily for Constipation. -Continue Maalox/Mylanta 30 ml Q4H PRN for Indigestion. -Continue  Hydroxyzine 25 mg TID PRN for Anxiety. -Continue Trazodone 50 mg QHS PRN for sleep.    Discharge Planning: Social work and case management to assist with discharge planning and identification of hospital follow-up needs prior to discharge Estimated LOS: 5-7 days Discharge Concerns: Need to establish a safety plan; Medication compliance and effectiveness Discharge Goals: Return home with outpatient referrals for mental health follow-up including medication management/psychotherapy.  Armando Reichert, MD 12/07/2020, 11:50 AM

## 2020-12-07 NOTE — Group Note (Signed)
LCSW Group Therapy Note   Group Date: 12/07/2020 Start Time: 1300 End Time: 1400  Therapy Type: Group Therapy  Participation Level:  Active   Patients received a worksheet with an outline of 2 gingerbread men with a separation in the middle of the page. One sign designated what the pt sees about themselves and the other is what others see. Pts were asked to introduce themselves and share something they like about themself. Pts were then asked to draw, write or color how they view themselves as well as how they are viewed by others. CSW led discussion about the feelings and words associated with each side.   Patient Summary:   During introductions pt shared their name and stated they liked open minded about themselves. Pt was appropriate and participated in group discussion.    Felizardo Hoffmann, LCSWA 12/07/2020  1:21 PM

## 2020-12-07 NOTE — BHH Group Notes (Signed)
BHH Group Notes:  (Nursing/MHT/Case Management/Adjunct)  Date:  12/07/2020  Time:  10:16 PM  Type of Therapy:  Psychoeducational Skills  Participation Level:  None  Participation Quality:  Resistant  Affect:  Resistant  Cognitive:  Lacking  Insight:  None  Engagement in Group:  None  Modes of Intervention:  Education  Summary of Progress/Problems: The patient did not attend the evening A.A.meeting.   Hazle Coca S 12/07/2020, 10:16 PM

## 2020-12-07 NOTE — Group Note (Signed)
Recreation Therapy Group Note   Group Topic:Stress Management  Group Date: 12/07/2020 Start Time: 0930 End Time: 0945 Facilitators: Caroll Rancher, [place] Location: [address] Dayroom   Goal Area(s) Addresses:  Patient will identify positive stress management techniques. Patient will identify benefits of using stress management post d/c.  Group Description:  Meditation.  LRT played a meditation that focused on self trust and how we sometimes doubt ourselves due to past mistakes and what it takes to regain that confidence/trust in ones self.  Patients were to listen, follow along and let their breathing relax each person as much as possible.   Affect/Mood: Appropriate   Participation Level: Engaged   Participation Quality: Independent   Behavior: Appropriate   Speech/Thought Process: Focused   Insight: Good   Judgement: Good   Modes of Intervention: Meditation   Patient Response to Interventions:  Engaged   Education Outcome:  Acknowledges education and In group clarification offered    Clinical Observations/Individualized Feedback:  Pt attended and participated in group.    Plan: Continue to engage patient in RT group sessions 2-3x/week.   Caroll Rancher, LRT,CTRS 12/07/2020 12:06 PM

## 2020-12-08 DIAGNOSIS — F332 Major depressive disorder, recurrent severe without psychotic features: Principal | ICD-10-CM

## 2020-12-08 LAB — T3, FREE: T3, Free: 2.9 pg/mL (ref 2.0–4.4)

## 2020-12-08 MED ORDER — HYDROXYZINE HCL 25 MG PO TABS
25.0000 mg | ORAL_TABLET | Freq: Four times a day (QID) | ORAL | Status: DC | PRN
  Administered 2020-12-08: 25 mg via ORAL
  Filled 2020-12-08: qty 1

## 2020-12-08 NOTE — Progress Notes (Signed)
   12/08/20 2300  Psych Admission Type (Psych Patients Only)  Admission Status Voluntary  Psychosocial Assessment  Patient Complaints None  Eye Contact Fair  Facial Expression Animated  Affect Appropriate to circumstance  Speech Logical/coherent  Interaction Assertive  Motor Activity Other (Comment)  Appearance/Hygiene Unremarkable  Behavior Characteristics Cooperative  Mood Pleasant  Thought Process  Coherency WDL  Content WDL  Delusions None reported or observed  Perception WDL  Hallucination None reported or observed  Judgment WDL  Confusion None  Danger to Self  Current suicidal ideation? Denies  Danger to Others  Danger to Others None reported or observed

## 2020-12-08 NOTE — Progress Notes (Signed)
Pt visible on the unit much of the evening, pt given PRN Melatonin and Vistaril per [facility].     12/08/20 0000  Psych Admission Type (Psych Patients Only)  Admission Status Voluntary  Psychosocial Assessment  Patient Complaints None  Eye Contact Fair  Facial Expression Animated  Affect Appropriate to circumstance  Speech Logical/coherent  Interaction Assertive  Motor Activity Other (Comment) (WDL)  Appearance/Hygiene Unremarkable  Behavior Characteristics Cooperative;Appropriate to situation  Mood Pleasant  Thought Process  Coherency WDL  Content WDL  Delusions None reported or observed  Perception WDL  Hallucination None reported or observed  Judgment WDL  Confusion None  Danger to Self  Current suicidal ideation? Denies  Danger to Others  Danger to Others None reported or observed

## 2020-12-08 NOTE — Progress Notes (Signed)
[facility] MD Progress Note  12/08/2020 10:43 AM Angel Carpenter  MRN:  [id number] Subjective:  In Short : Angel Carpenter is a 35 year old female with past psychiatry history of MDD without psychosis, history of self-harm behaviors initially presented to [place] ED for suicidal ideation and worsening depression.  Chart Review from last 24 hours:  The patient's chart was reviewed and nursing notes were reviewed. The patient's case was discussed in multidisciplinary team meeting.  Per [facility] patient has been compliant with her scheduled medication and received as needed  Vistaril and melatonin yesterday.  Her last CIWA was 0.  Information Obtained Today During Patient Interview:  Pt is seen and examined today. Pt states her mood is "aggravated".  She states she was talking to her son on phone and the other patient started yelling on her.  She states her son is having some breathing issues and he may have to go to hospital at [facility]. She is anxious and states her husband does not know how to call her son's nurse.  Overall, she reports improvement in her depression.  Pt slept well last night. Pt states her appetite is good.  She denies any withdrawal symptoms.  She denies headache today.  She states that she told her son superficially about her hospitalization.  She states that she will answer all their questions when she gets back home but will not go into details. Currently, Pt denies any suicidal ideation, homicidal ideation and, visual and auditory hallucination. She denies any paranoia.  She states she wants to keep both options for therapy open including trauma therapy with she had been getting before hospitalization and intensive outpatient therapy. Discussed results of T3 and T4 which are normal but she would need to follow-up with PCP to repeat these tests in 4 weeks. Patient verbalized understanding.  Pt denies any nausea, vomiting, dizziness, chest pain, SOB, abdominal pain, diarrhea, and  constipation. Pt denies any medication side effects and has been tolerating it well. Pt denies any concerns.     Principal Problem: MDD (major depressive disorder), recurrent severe, without psychosis ([place]) Diagnosis: Principal Problem:   MDD (major depressive disorder), recurrent severe, without psychosis ([place]) Active Problems:   PTSD (post-traumatic stress disorder)   Marijuana abuse   Benzodiazepine abuse ([place])  Total Time spent with patient: 30 minutes  Past Psychiatric History: see H&P  Past Medical History:  Past Medical History:  Diagnosis Date   Depression    Hypertension     Past Surgical History:  Procedure Laterality Date   INNER EAR SURGERY     Left   NO PAST SURGERIES     Family History:  Family History  Problem Relation Age of Onset   Diabetes Father    Family Psychiatric  History: Father -died due to suicide attempt by overdose.  Patient does not know diagnosis but he was on Seroquel. Son-depression Uncle-died by suicide Social History:  Social History   Substance and Sexual Activity  Alcohol Use No     Social History   Substance and Sexual Activity  Drug Use Yes   Comment: delta gummies    Social History   Socioeconomic History   Marital status: Married    Spouse name: Not on file   Number of children: Not on file   Years of education: Not on file   Highest education level: Not on file  Occupational History   Not on file  Tobacco Use   Smoking status: Some Days  Smokeless tobacco: Never  Vaping Use   Vaping Use: Never used  Substance and Sexual Activity   Alcohol use: No   Drug use: Yes    Comment: delta gummies   Sexual activity: Yes    Birth control/protection: None  Other Topics Concern   Not on file  Social History Narrative   Not on file   Social Determinants of Health   Financial Resource Strain: Not on file  Food Insecurity: Not on file  Transportation Needs: Not on file  Physical Activity: Not on file  Stress: Not  on file  Social Connections: Not on file   Additional Social History:                         Sleep: Good  Appetite:  Good improved.  Current Medications: Current Facility-Administered Medications  Medication Dose Route Frequency Provider Last Rate Last Admin   acetaminophen (TYLENOL) tablet 650 mg  650 mg Oral Q6H PRN Ethelene Hal, NP   650 mg at 12/06/20 1112   alum & mag hydroxide-simeth (MAALOX/MYLANTA) 200-200-20 MG/5ML suspension 30 mL  30 mL Oral Q6H PRN Armando Reichert, MD       clonazePAM (KLONOPIN) tablet 0.5 mg  0.5 mg Oral Daily Nelda Marseille, Amy E, MD   0.5 mg at 12/08/20 0800   escitalopram (LEXAPRO) tablet 10 mg  10 mg Oral Daily Armando Reichert, MD   10 mg at 12/08/20 0800   hydrOXYzine (ATARAX/VISTARIL) tablet 25 mg  25 mg Oral Q6H PRN Harlow Asa, MD   25 mg at 12/07/20 2128   ibuprofen (ADVIL) tablet 400 mg  400 mg Oral Q6H PRN Ethelene Hal, NP       loperamide (IMODIUM) capsule 2-4 mg  2-4 mg Oral PRN Harlow Asa, MD       LORazepam (ATIVAN) tablet 1 mg  1 mg Oral Q6H PRN Nelda Marseille, Amy E, MD       magnesium hydroxide (MILK OF MAGNESIA) suspension 30 mL  30 mL Oral Daily PRN Armando Reichert, MD       melatonin tablet 6 mg  6 mg Oral QHS PRN Bobbitt, [name], NP   6 mg at 12/07/20 2128   ondansetron (ZOFRAN-ODT) disintegrating tablet 4 mg  4 mg Oral Q6H PRN Harlow Asa, MD       traZODone (DESYREL) tablet 50 mg  50 mg Oral QHS PRN Ethelene Hal, NP        Lab Results:  Results for orders placed or performed during the hospital encounter of 12/04/20 (from the past 48 hour(s))  T3, free     Status: None   Collection Time: 12/06/20  6:47 PM  Result Value Ref Range   T3, Free 2.9 2.0 - 4.4 pg/mL    Comment: (NOTE) Performed At: [facility] [address] [place], [place] [id number] Rush Farmer MD [id number]   T4, free     Status: None   Collection Time: 12/06/20  6:47 PM  Result Value Ref Range   Free T4  0.96 0.61 - 1.12 ng/dL    Comment: (NOTE) Biotin ingestion may interfere with free T4 tests. If the results are inconsistent with the TSH level, previous test results, or the clinical presentation, then consider biotin interference. If needed, order repeat testing after stopping biotin. Performed at [facility], [place] [address], [place], [place] [address]     Blood Alcohol level:  Lab Results  Component  Value Date   ETH <10 12/03/2020    Metabolic Disorder Labs: Lab Results  Component Value Date   HGBA1C 4.7 (L) 12/05/2020   MPG 88.19 12/05/2020   No results found for: PROLACTIN Lab Results  Component Value Date   CHOL 166 12/05/2020   TRIG 80 12/05/2020   HDL 53 12/05/2020   CHOLHDL 3.1 12/05/2020   VLDL 16 12/05/2020   LDLCALC 97 12/05/2020    Physical Findings: AIMS: Facial and Oral Movements Muscles of Facial Expression: None, normal Lips and Perioral Area: None, normal Jaw: None, normal Tongue: None, normal,Extremity Movements Upper (arms, wrists, hands, fingers): None, normal Lower (legs, knees, ankles, toes): None, normal, Trunk Movements Neck, shoulders, hips: None, normal, Overall Severity Severity of abnormal movements (highest score from questions above): None, normal Incapacitation due to abnormal movements: None, normal Patient's awareness of abnormal movements (rate only patient's report): No Awareness, Dental Status Current problems with teeth and/or dentures?: No Does patient usually wear dentures?: No  CIWA:  CIWA-Ar Total: 0 COWS:     Musculoskeletal: Strength & Muscle Tone: within normal limits Gait & Station: normal Patient leans: N/A  Psychiatric Specialty Exam:  Presentation  General Appearance: Fairly Groomed  Eye Contact:Good  Speech:Clear and Coherent  Speech Volume:Normal  Handedness:Right   Mood and Affect  Mood:anxious  Affect:congruent   Thought Process  Thought Processes:Coherent; Goal  Directed  Descriptions of Associations:Intact  Orientation:Full (Time, Place and Person)  Thought Content:WDL  - denies AVH, paranoia or delusions  History of Schizophrenia/Schizoaffective disorder:No  Duration of Psychotic Symptoms:No data recorded  Hallucinations:Hallucinations: None  Ideas of Reference:None  Suicidal Thoughts:Suicidal Thoughts: No  Homicidal Thoughts:Homicidal Thoughts: No   Sensorium  Memory:Immediate Good; Recent Good  Judgment:Fair  Insight:Improving    Executive Functions  Concentration:Good  Attention Span:Good  Recall:Good  Fund of Knowledge:Good  Language:Good   Psychomotor Activity  Psychomotor Activity:Psychomotor Activity: Normal   Assets  Assets:Communication Skills; Desire for Improvement; Financial Resources/Insurance; Talents/Skills   Sleep  Sleep:6  Physical Exam Vitals and nursing note reviewed.  Constitutional:      General: She is not in acute distress.    Appearance: Normal appearance. She is not ill-appearing, toxic-appearing or diaphoretic.  HENT:     Head: Normocephalic.  Pulmonary:     Effort: Pulmonary effort is normal.  Neurological:     General: No focal deficit present.     Mental Status: She is alert and oriented to person, place, and time.   Review of Systems  Respiratory:  Negative for shortness of breath.   Cardiovascular:  Negative for chest pain.  Gastrointestinal:  Negative for nausea and vomiting.  Neurological:  Negative for headaches.  Blood pressure 104/83, pulse 79, temperature (!) 97.5 F (36.4 C), temperature source Oral, resp. rate 18, height 5\' 8"  (1.727 m), weight 69.4 kg, last menstrual period 11/30/2020, SpO2 99 %. Body mass index is 23.26 kg/m.   Treatment Plan Summary: Isabellamarie Randa is a 35 year old female with past psychiatry history of MDD without psychosis and history of self-harm behaviors, who initially presented to [facility] ED for suicidal ideation and  worsening depression. Daily contact with patient to assess and evaluate symptoms and progress in treatment   Labs reviewed  CBC within normal limits CMP-sodium 139, potassium 3.4, glucose 97, AST 17, ALT 15, normal renal function UDS-positive for benzodiazepine and THC Preg test -negative Respiratory panel -Influenza A and B negative, Covid Negative Ethanol level <10 Salicylate level<7 EKG -QTC 417 Repeat BMP on  11/12 shows sodium 137, potassium 4.1,lipid panel shows cholesterol 166, LDL 97, triglyceride 80. HbA1C-4.7  TSH - 0.312, T4- 0.96, T3 2.9   Plan  -Monitor Vitals. -Monitor for Suicidal Ideation. -Monitor for withdrawal symptoms. -Monitor for medication side effects.   MDD, recurrent, severe without psychosis (r/o substance-induced mood disorder) PTSD Cluster B traits R/o ADD -Wellbutrin stopped.  -Continue Lexapro 10 mg daily  -Continue melatonin 6 mg at bedtime as needed -Patient filled mood disorder questionnaire with 3 yes answers for question 1.  Yes for question 2 and minor problem for question 3. Pt doesn't meet criteria for Bipolar disorder based on this screening questionnaire.   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  Encouraged to discontinue. -Continue Klonopin 0.5 mg daily today and tomorrow then stop. Verified at [facility].  Last filled 90 tablets on 10/18/2020.   -Continue CIWA for benzodiazepine withdrawal monitoring. (Recent CIWA 0)  R/o hyperthyroidism (TSH low at 0.312) - T4- 0.96, T3 2.9 -Need follow up with PCP to repeat testing in 4 weeks.   Hypokalemia - (resolved) - Repeat BMP shows K+ 4.1 on 11/12   PRN's  -Continue Tylenol 650 mg every 6 hours as needed for pain or fever -Continue Milk of Magnesia 30 ml PRN Daily for Constipation. -Continue Maalox/Mylanta 30 ml Q4H PRN for Indigestion. -Continue Hydroxyzine 25 mg TID PRN for Anxiety. -Continue  Trazodone 50 mg QHS PRN for sleep.    Discharge Planning: Social work and case management to assist with discharge planning and identification of hospital follow-up needs prior to discharge Estimated LOS: 5-7 days Discharge Concerns: Need to establish a safety plan; Medication compliance and effectiveness Discharge Goals: Return home with outpatient referrals for mental health follow-up including medication management/psychotherapy.  Armando Reichert, MD PGY2 12/08/2020, 10:43 AM

## 2020-12-08 NOTE — Progress Notes (Signed)
Tonight's wrap up group went over the daily reflection sheet. The patient rated her overall day a 10. The patient's goal for the day was not letting others control her emotions. The patient stated that she reached her goal for the day. The patient was pleasant and appropriate during group.

## 2020-12-08 NOTE — BHH Group Notes (Signed)
The focus of this group is to help patients establish daily goals to achieve during treatment and discuss how the patient can incorporate goal setting into their daily lives to aide in recovery.  Pt did not attend morning goals group.  

## 2020-12-08 NOTE — Progress Notes (Signed)
D: Patient is improving with her treatment. She is attending groups and participating. She is interacting well with staff and her peers.  She denies any thoughts of self harm. She is not responding to internal stimuli. Her goal today is to "not let others take over my emotions." She is sleeping and eating well; she denies any physical discomfort. Her mood is brighter since her admission. She feels she is doing well and is looking forward to seeing her children.  A: Continue to monitor medication management and MD orders.  Safety checks completed every 15 minutes per protocol.  Offer support and encouragement as needed.  R: Patient is receptive to staff; her behavior is appropriate.     12/08/20 1200  Psych Admission Type (Psych Patients Only)  Admission Status Voluntary  Psychosocial Assessment  Patient Complaints None  Eye Contact Fair  Facial Expression Animated  Affect Appropriate to circumstance  Speech Logical/coherent  Interaction Assertive  Motor Activity Other (Comment)  Appearance/Hygiene Unremarkable  Behavior Characteristics Cooperative  Mood Pleasant  Thought Process  Coherency WDL  Content WDL  Delusions None reported or observed  Perception WDL  Hallucination None reported or observed  Judgment WDL  Confusion None  Danger to Self  Current suicidal ideation? Denies  Danger to Others  Danger to Others None reported or observed

## 2020-12-09 MED ORDER — ESCITALOPRAM OXALATE 10 MG PO TABS
10.0000 mg | ORAL_TABLET | Freq: Every day | ORAL | 0 refills | Status: DC
Start: 2020-12-10 — End: 2022-01-19

## 2020-12-09 MED ORDER — HYDROXYZINE HCL 25 MG PO TABS: 25.0000 mg | ORAL_TABLET | Freq: Three times a day (TID) | ORAL | 0 refills | Status: DC | PRN

## 2020-12-09 MED ORDER — MELATONIN 3 MG PO TABS: 6.0000 mg | ORAL_TABLET | Freq: Every evening | ORAL | 0 refills | Status: DC | PRN

## 2020-12-09 NOTE — Progress Notes (Signed)
  [facility] Adult Case Management Discharge Plan :  Will you be returning to the same living situation after discharge:  Yes,  Home  At discharge, do you have transportation home?: Yes,  Friend  Do you have the ability to pay for your medications: Yes,  Insurance   Release of information consent forms completed and in the chart;  Patient's signature needed at discharge.  Patient to Follow up at:  Follow-up Information     [facility]. Call.   Why: Per provider's request, please call to schedule/confirm your appointment for therapy services. Contact information: [address] [place], [place] [address]  Phone: [phone] Fax: [phone]        Egbert Garibaldi, Excell Seltzer., MD. Go on 12/15/2020.   Specialty: Family Medicine Why: You have a hospital follow up  appointment with your primary care provider for medication management services on 12/15/20 at 1:30 pm.  This appointment will be held in person. Contact information: [address] [place] [address] [phone]         BEHAVIORAL HEALTH PARTIAL HOSPITALIZATION PROGRAM Follow up on 12/10/2020.   Specialty: Behavioral Health Why: You have an appointment for the virtual partial hospitalization program on 12/10/20 at 1:00 pm.  This will be a Virtual appointment and an email will be sent to you.  If you have any questions, please call  [phone]. Contact information: [address] [id number] mc [place] [address] [phone]                Next level of care provider has access to [facility] Link:yes  Safety Planning and Suicide Prevention discussed: Yes,  with patient and friend      Has patient been referred to the Quitline?: Patient refused referral  Patient has been referred for addiction treatment: N/A  Aram Beecham, LCSWA 12/09/2020, 9:42 AM
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
## 2020-12-09 NOTE — Progress Notes (Signed)
Pt discharged from unit. Refer to earlier dar note with discharge data. Pt escorted from unit with all belgongings.

## 2020-12-09 NOTE — Progress Notes (Signed)
Discharge order received for this patient.  Patient reports she feels ready to go, denies any SI/HI/ A V Hallucinations. Patient states '' I have learned a lot and I feel ready to go, and take care of my life. I've really appreciated all the care i've received. Pt rates depression, anxiety, and hopelessness at 0/10 on scale, 10 being worst. Patient also states her goal is not letting people control my emotions, and to reflect on my coping skills like breathing exercises and counting backwards. '' Discharge instructions reviewed at length with the patient, as well as crisis interventions and home medications. Patient verbalized understanding and AVS copy, BH transition record, letter from SW and discharge SRA all provided to patient. The patient verbalized understanding of follow up care. No signs of acute decompensation. All belongings returned to the patient. Patient requested that home supply medications of wellbutrin and clonazepam are destroyed as she no longer needs and states '' I don't even want to have those in my hands. '' Home medication was destroyed, control drug crushed and placed in proper waste dispense with second witness, Serena Colonel NP, and this Clinical research associate. Pt signed on belonging sheet as well indicating consent to waste medications. Pt is awaiting ride at 1200 noon prior to being escorted off the unit.

## 2020-12-09 NOTE — Discharge Summary (Signed)
Physician Discharge Summary Note  Patient:  Angel Carpenter is an 35 y.o., female MRN:  [id number] DOB:  03-24-1985 Patient phone:  [phone] (home)  Patient address:   [place] [address],  Total Time spent with patient: 30 minutes  Date of Admission:  12/04/2020 Date of Discharge: 12/09/2020  Reason for Admission:  Angel Carpenter is a 36 year old female with past psychiatry history of MDD without psychosis, history of self-harm behaviors initially presented to [facility] ED for suicidal ideation and worsening depression. In the ED, she reported that she took a handful of benzodiazepines before coming to ED.  Patient was recently seen on 11/9 at [facility] and was referred to intensive outpatient care.  Patient was assessed by psychiatry in the ED and was recommended for inpatient psychiatric admission. Patient was transferred to [facility] on 12/04/2020 for stabilization and management  of her symptoms.  See H&P for Details.   Principal Problem: MDD (major depressive disorder), recurrent severe, without psychosis ([place]) Discharge Diagnoses: Principal Problem:   MDD (major depressive disorder), recurrent severe, without psychosis ([place]) Active Problems:   PTSD (post-traumatic stress disorder)   Marijuana abuse   Benzodiazepine abuse ([place])   Past Psychiatric History: MDD without psychosis, history of self-injurious behavior- Patient has history of self-injurious behavior since high school.  Patient started cutting again 2 months ago due to overwhelming thoughts.  Per patient -history of 1 previous psychiatric admission  in 2021 due to suicidal attempt by overdosing on Tylenol p.m. no records of admission in EMR. She has been getting trauma therapy since 2021 at [facility].  She sees her therapist once a week.  Patient has been taking Wellbutrin XL 300 mg since beginning of 2022 and Klonopin 0.5 mg 3 times daily since last year.  Patient states she wanted to stop  Wellbutrin as she has been feeling impatient, hostile, impulsive but her doctor would not stop it so she started weaning herself by cutting Wellbutrin XL to half.  Patient has been getting these prescriptions from PCP.  Patient states she has tried some other antidepressant in the past but does not remember the name.  She took it only for short period and did not like it. Chart review shows that patient was on Zoloft 25 mg in 2015.  Chart review shows patient was admitted to [facility] in 2007 and [address] hall.  Records not available.  Past Medical History:  Past Medical History:  Diagnosis Date   Depression    Hypertension     Past Surgical History:  Procedure Laterality Date   INNER EAR SURGERY     Left   NO PAST SURGERIES     Family History:  Family History  Problem Relation Age of Onset   Diabetes Father    Family Psychiatric  History: Father -died due to suicide attempt by overdose.  Patient does not know diagnosis but he was on Seroquel.  Son-depression. Uncle-died by suicide. Social History:  Social History   Substance and Sexual Activity  Alcohol Use No     Social History   Substance and Sexual Activity  Drug Use Yes   Comment: delta gummies    Social History   Socioeconomic History   Marital status: Married    Spouse name: Not on file   Number of children: Not on file   Years of education: Not on file   Highest education level: Not on file  Occupational History   Not on file  Tobacco Use  Smoking status: Some Days   Smokeless tobacco: Never  Vaping Use   Vaping Use: Never used  Substance and Sexual Activity   Alcohol use: No   Drug use: Yes    Comment: delta gummies   Sexual activity: Yes    Birth control/protection: None  Other Topics Concern   Not on file  Social History Narrative   Not on file   Social Determinants of Health   Financial Resource Strain: Not on file  Food Insecurity: Not on file  Transportation Needs: Not on file  Physical  Activity: Not on file  Stress: Not on file  Social Connections: Not on file    Hospital Course:  On admission patient was integrated into therapeutic milieu and placed on appropriate precautions.  Pt was started on Lexapro 10 mg daily.  Patient's home Wellbutrin was stopped.  Clonopin was tapered and stopped during this hospitalization.  Patient was also put on as needed hydroxyzine for anxiety and melatonin and trazodone for sleep.Patient's TSH was low at - 0.312 with T4- 0.96, T3 2.9.  Patient was recommended to follow-up with PCP to recheck thyroid labs in 1 month. Pt's mood, sleep, affect and anxiety improved while her time at the inpatient unit. Pt was seen daily on rounds, and case discussed at multidisciplinary team meeting to ensure biopsychosocial approach to treatment. Pt was educated on need for compliance with his medications. Pt was also educated on need to abstain from use of any drugs. During her stay Patient did not display any dangerous, violent or suicidal behavior on the unit.  Pt interacted with patients & staff appropriately, participated appropriately in the group sessions/therapies. Pt's medications were addressed & adjusted to meet her needs. Pt was recommended for outpatient follow-up care & medication management upon discharge to assure her continuity of care.   At the time of discharge, patient is not reporting any acute suicidal/homicidal ideations. Pt reports improved mood, sleep and anxiety. Pt currently denies any new issues or concerns.  Safety planning done with patient.  Patient feels safe to discharge.  Education and supportive counseling provided throughout her hospital stay & upon discharge.  Labs during this hospitalization.  CBC within normal limits CMP-sodium 139, potassium 3.4, glucose 97, AST 17, ALT 15, normal renal function UDS-positive for benzodiazepine and THC Preg test -negative Respiratory panel -Influenza A and B negative, Covid Negative Ethanol  level [id number] Salicylate level<7 EKG -QTC 417 Repeat BMP on 11/12 shows sodium 137, potassium 4.1,lipid panel shows cholesterol 166, LDL 97, triglyceride 80. HbA1C-4.7  TSH - 0.312, T4- 0.96, T3 2.9  Physical Findings: AIMS: Facial and Oral Movements Muscles of Facial Expression: None, normal Lips and Perioral Area: None, normal Jaw: None, normal Tongue: None, normal,Extremity Movements Upper (arms, wrists, hands, fingers): None, normal Lower (legs, knees, ankles, toes): None, normal, Trunk Movements Neck, shoulders, hips: None, normal, Overall Severity Severity of abnormal movements (highest score from questions above): None, normal Incapacitation due to abnormal movements: None, normal Patient's awareness of abnormal movements (rate only patient's report): No Awareness, Dental Status Current problems with teeth and/or dentures?: No Does patient usually wear dentures?: No  CIWA:  CIWA-Ar Total: 0 COWS:     Musculoskeletal: Strength & Muscle Tone: within normal limits Gait & Station: normal Patient leans: N/A   Psychiatric Specialty Exam:  Presentation  General Appearance: Fairly Groomed  Eye Contact:Good  Speech:Clear and Coherent  Speech Volume:Normal  Handedness:Right   Mood and Affect  Mood:Euthymic  Affect:Appropriate; Congruent   Thought Process  Thought Processes:Coherent; Goal Directed  Descriptions of Associations:Intact  Orientation:Full (Time, Place and Person)  Thought Content:WDL  History of Schizophrenia/Schizoaffective disorder:No  Duration of Psychotic Symptoms:No data recorded Hallucinations:Hallucinations: None  Ideas of Reference:None  Suicidal Thoughts:Suicidal Thoughts: No  Homicidal Thoughts:Homicidal Thoughts: No   Sensorium  Memory:Immediate Good; Recent Good  Judgment:Fair  Insight:Good   Executive Functions  Concentration:Good  Attention Span:Good  Alicia of  Knowledge:Good  Language:Good   Psychomotor Activity  Psychomotor Activity:Psychomotor Activity: Normal   Assets  Assets:Communication Skills; Desire for Improvement; Financial Resources/Insurance; Talents/Skills; Physical Health; Resilience; Social Support   Sleep  Sleep:Sleep: Good    Physical Exam: Physical Exam Vitals and nursing note reviewed.  Constitutional:      General: She is not in acute distress.    Appearance: Normal appearance. She is not ill-appearing, toxic-appearing or diaphoretic.  HENT:     Head: Normocephalic.  Pulmonary:     Effort: Pulmonary effort is normal.  Neurological:     General: No focal deficit present.     Mental Status: She is alert and oriented to person, place, and time.   Review of Systems  Constitutional:  Negative for chills and fever.  HENT:  Negative for hearing loss.   Eyes:  Negative for blurred vision.  Respiratory:  Negative for cough and shortness of breath.   Cardiovascular:  Negative for chest pain.  Gastrointestinal:  Negative for abdominal pain, diarrhea, nausea and vomiting.  Neurological:  Negative for dizziness, focal weakness and headaches.  Psychiatric/Behavioral:  Negative for depression, hallucinations and suicidal ideas. The patient is not nervous/anxious.   Blood pressure (!) 110/96, pulse 94, temperature (!) 97.3 F (36.3 C), temperature source Oral, resp. rate 18, height 5\' 8"  (1.727 m), weight 69.4 kg, last menstrual period 11/30/2020, SpO2 99 %. Body mass index is 23.26 kg/m.   Social History   Tobacco Use  Smoking Status Some Days  Smokeless Tobacco Never   Tobacco Cessation:  N/A, patient does not currently use tobacco products   Blood Alcohol level:  Lab Results  Component Value Date   ETH <10 0000000    Metabolic Disorder Labs:  Lab Results  Component Value Date   HGBA1C 4.7 (L) 12/05/2020   MPG 88.19 12/05/2020   No results found for: PROLACTIN Lab Results  Component Value Date    CHOL 166 12/05/2020   TRIG 80 12/05/2020   HDL 53 12/05/2020   CHOLHDL 3.1 12/05/2020   VLDL 16 12/05/2020   LDLCALC 97 12/05/2020    See Psychiatric Specialty Exam and Suicide Risk Assessment completed by Attending Physician prior to discharge.  Discharge destination:  Home  Is patient on multiple antipsychotic therapies at discharge:  No   Has Patient had three or more failed trials of antipsychotic monotherapy by history:  No  Recommended Plan for Multiple Antipsychotic Therapies: NA  Discharge Instructions     Diet - low sodium heart healthy   Complete by: As directed    Increase activity slowly   Complete by: As directed       Allergies as of 12/09/2020       Reactions   Sulfa Antibiotics Swelling   Mouth breaks out        Medication List     STOP taking these medications    buPROPion 300 MG 24 hr tablet Commonly known as: WELLBUTRIN XL   clonazePAM 0.5 MG tablet Commonly known as: KLONOPIN       TAKE these medications  Indication  escitalopram 10 MG tablet Commonly known as: LEXAPRO Take 1 tablet (10 mg total) by mouth daily. Start taking on: December 10, 2020  Indication: Major Depressive Disorder   hydrOXYzine 25 MG tablet Commonly known as: ATARAX/VISTARIL Take 1 tablet (25 mg total) by mouth 3 (three) times daily as needed (anxiety/agitation or CIWA < or = 10).  Indication: Feeling Anxious   melatonin 3 MG Tabs tablet Take 2 tablets (6 mg total) by mouth at bedtime as needed.  Indication: Trouble Sleeping        Follow-up Information     [facility]. Call.   Why: Per provider's request, please call to schedule/confirm your appointment for therapy services. Contact information: [address] [place], [place] [address]  Phone: [phone] Fax: [phone]        Egbert Garibaldi, Excell Seltzer., MD. Go on 12/15/2020.   Specialty: Family Medicine Why: You have a hospital follow up  appointment with your primary care  provider for medication management services on 12/15/20 at 1:30 pm.  This appointment will be held in person. Contact information: [address] [place] [address] [phone]         BEHAVIORAL HEALTH PARTIAL HOSPITALIZATION PROGRAM Follow up on 12/10/2020.   Specialty: Behavioral Health Why: You have an appointment for the virtual partial hospitalization program on 12/10/20 at 1:00 pm.  This will be a Virtual appointment and an email will be sent to you.  If you have any questions, please call  [phone]. Contact information: [address] [id number] mc [place] [address] [phone]                Follow-up recommendations:  Activity:  As Tolerated Diet:  Heart Healthy Patient was recommended to follow-up with PCP to recheck thyroid labs in 1 month.  Comments:  Prescriptions sent to pharmacy at discharge. Patient agreeable to plan.  Given opportunity to ask questions.  Appears to feel comfortable with discharge denies any current suicidal or homicidal thought. Patient is also instructed prior to discharge to: Take all medications as prescribed by her mental healthcare provider. Report any adverse effects and or reactions from the medicines to her outpatient provider promptly. Patient has been instructed & cautioned: To not engage in alcohol and or illegal drug use while on prescription medicines. In the event of worsening symptoms, patient is instructed to call the crisis hotline, 911 and or go to the nearest ED for appropriate evaluation and treatment of symptoms. To follow-up with her primary care provider for your other medical issues, concerns and or health care needs.   Signed: Karsten Ro, MD 12/09/2020, 3:43 PM

## 2020-12-09 NOTE — [facility] Group Notes (Signed)
[facility] Group Notes:  (Nursing/MHT/Case Management/Adjunct)  Date:  12/09/2020  Time:  8:55 AM  Type of Therapy:   Goals  Participation Level:  Active  Participation Quality:  Appropriate  Affect:  Appropriate  Cognitive:  Appropriate  Insight:  Appropriate  Engagement in Group:  Engaged  Modes of Intervention:  Orientation  Summary of Progress/Problems: Goal is to discharge  Azalee Course 12/09/2020, 8:55 AM

## 2020-12-09 NOTE — [facility] Group Notes (Signed)
PsychoEducational Group Note- Gratitude and the impact positive affirmations can have and impact one's mental health were discussed in group. Patients were educated on daily affirmations and the way gratitude can shift thinking. Patients were asked to read insights on gratitude and share one thing they are grateful for. Patient participated and was appropriate in group.

## 2020-12-10 ENCOUNTER — Other Ambulatory Visit: Payer: Self-pay

## 2020-12-10 ENCOUNTER — Other Ambulatory Visit (HOSPITAL_COMMUNITY): Payer: 59 | Attending: Psychiatry | Admitting: Professional

## 2020-12-10 DIAGNOSIS — F332 Major depressive disorder, recurrent severe without psychotic features: Secondary | ICD-10-CM

## 2020-12-11 ENCOUNTER — Other Ambulatory Visit (HOSPITAL_COMMUNITY): Payer: 59

## 2020-12-11 ENCOUNTER — Telehealth (HOSPITAL_COMMUNITY): Payer: Self-pay | Admitting: Professional

## 2020-12-13 NOTE — [facility] Group Notes (Signed)
Spiritual care group on grief and loss facilitated by chaplain Dyanne Carrel, [facility]   Group Goal:   Support / Education around grief and loss   Members engage in facilitated group support and psycho-social education.   Group Description:   Following introductions and group rules, group members engaged in facilitated group dialog and support around topic of loss, with particular support around experiences of loss in their lives. Group Identified types of loss (relationships / self / things) and identified patterns, circumstances, and changes that precipitate losses. Reflected on thoughts / feelings around loss, normalized grief responses, and recognized variety in grief experience. Group noted Worden's four tasks of grief in discussion.   Group drew on Adlerian / Rogerian, narrative, MI,   Patient Progress: Urvi participated in group and actively engaged in group conversation.  Chaplain Dyanne Carrel, [facility] Pager, [phone] 4:15 PM

## 2020-12-14 ENCOUNTER — Other Ambulatory Visit: Payer: Self-pay

## 2020-12-14 ENCOUNTER — Other Ambulatory Visit (HOSPITAL_COMMUNITY): Payer: 59
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
## 2020-12-15 ENCOUNTER — Other Ambulatory Visit (HOSPITAL_COMMUNITY): Payer: 59

## 2020-12-15 ENCOUNTER — Other Ambulatory Visit: Payer: Self-pay

## 2020-12-16 ENCOUNTER — Other Ambulatory Visit (HOSPITAL_COMMUNITY): Payer: 59

## 2020-12-16 ENCOUNTER — Ambulatory Visit (HOSPITAL_COMMUNITY): Payer: 59

## 2020-12-21 ENCOUNTER — Ambulatory Visit (HOSPITAL_COMMUNITY): Payer: 59

## 2020-12-21 ENCOUNTER — Other Ambulatory Visit (HOSPITAL_COMMUNITY): Payer: 59

## 2020-12-22 ENCOUNTER — Ambulatory Visit (HOSPITAL_COMMUNITY): Payer: 59

## 2020-12-22 ENCOUNTER — Other Ambulatory Visit (HOSPITAL_COMMUNITY): Payer: 59

## 2020-12-23 ENCOUNTER — Other Ambulatory Visit (HOSPITAL_COMMUNITY): Payer: 59

## 2020-12-23 ENCOUNTER — Ambulatory Visit (HOSPITAL_COMMUNITY): Payer: 59

## 2020-12-24 ENCOUNTER — Other Ambulatory Visit (HOSPITAL_COMMUNITY): Payer: 59

## 2020-12-24 ENCOUNTER — Ambulatory Visit (HOSPITAL_COMMUNITY): Payer: 59

## 2020-12-25 ENCOUNTER — Ambulatory Visit (HOSPITAL_COMMUNITY): Payer: 59

## 2020-12-25 ENCOUNTER — Other Ambulatory Visit (HOSPITAL_COMMUNITY): Payer: 59

## 2020-12-28 ENCOUNTER — Other Ambulatory Visit (HOSPITAL_COMMUNITY): Payer: 59

## 2020-12-28 ENCOUNTER — Ambulatory Visit (HOSPITAL_COMMUNITY): Payer: 59

## 2020-12-29 ENCOUNTER — Ambulatory Visit (HOSPITAL_COMMUNITY): Payer: 59

## 2020-12-29 ENCOUNTER — Other Ambulatory Visit (HOSPITAL_COMMUNITY): Payer: 59

## 2020-12-30 ENCOUNTER — Ambulatory Visit (HOSPITAL_COMMUNITY): Payer: 59

## 2020-12-30 ENCOUNTER — Other Ambulatory Visit (HOSPITAL_COMMUNITY): Payer: 59

## 2020-12-31 ENCOUNTER — Other Ambulatory Visit (HOSPITAL_COMMUNITY): Payer: 59

## 2020-12-31 ENCOUNTER — Ambulatory Visit (HOSPITAL_COMMUNITY): Payer: 59

## 2021-01-01 ENCOUNTER — Ambulatory Visit (HOSPITAL_COMMUNITY): Payer: 59

## 2021-01-01 ENCOUNTER — Other Ambulatory Visit (HOSPITAL_COMMUNITY): Payer: 59

## 2021-04-23 ENCOUNTER — Emergency Department (HOSPITAL_COMMUNITY)
Admission: EM | Admit: 2021-04-23 | Discharge: 2021-04-23 | Disposition: A | Discharge status: Home / Self Care | Payer: BC Managed Care – PPO | Attending: Emergency Medicine | Admitting: Emergency Medicine

## 2021-04-23 ENCOUNTER — Emergency Department (HOSPITAL_COMMUNITY): Payer: BC Managed Care – PPO

## 2021-04-23 DIAGNOSIS — R55 Syncope and collapse: Secondary | ICD-10-CM | POA: Insufficient documentation

## 2021-04-23 DIAGNOSIS — R569 Unspecified convulsions: Secondary | ICD-10-CM | POA: Diagnosis not present

## 2021-04-23 DIAGNOSIS — R0789 Other chest pain: Secondary | ICD-10-CM | POA: Insufficient documentation

## 2021-04-23 DIAGNOSIS — M791 Myalgia, unspecified site: Secondary | ICD-10-CM | POA: Diagnosis not present

## 2021-04-23 LAB — BASIC METABOLIC PANEL
Anion gap: 8 (ref 5–15)
BUN: 11 mg/dL (ref 6–20)
CO2: 25 mmol/L (ref 22–32)
Calcium: 9.1 mg/dL (ref 8.9–10.3)
Chloride: 104 mmol/L (ref 98–111)
Creatinine, Ser: 0.89 mg/dL (ref 0.44–1.00)
GFR, Estimated: >60 mL/min (ref >60)
Glucose, Bld: 91 mg/dL (ref 70–99)
Potassium: 3.9 mmol/L (ref 3.5–5.1)
Sodium: 137 mmol/L (ref 135–145)

## 2021-04-23 LAB — CBG MONITORING, ED: Glucose-Capillary: 94 mg/dL (ref 70–99)

## 2021-04-23 LAB — CBC WITH DIFFERENTIAL/PLATELET
Abs Immature Granulocytes: 0.02 10*3/uL (ref 0.00–0.07)
Basophils Absolute: 0 10*3/uL (ref 0.0–0.1)
Basophils Relative: 0 %
Eosinophils Absolute: 0.1 10*3/uL (ref 0.0–0.5)
Eosinophils Relative: 2 %
HCT: 37.8 % (ref 36.0–46.0)
Hemoglobin: 12.6 g/dL (ref 12.0–15.0)
Immature Granulocytes: 0 %
Lymphocytes Relative: 26 %
Lymphs Abs: 1.9 10*3/uL (ref 0.7–4.0)
MCH: 31 pg (ref 26.0–34.0)
MCHC: 33.3 g/dL (ref 30.0–36.0)
MCV: 93.1 fL (ref 80.0–100.0)
Monocytes Absolute: 0.4 10*3/uL (ref 0.1–1.0)
Monocytes Relative: 6 %
Neutro Abs: 4.6 10*3/uL (ref 1.7–7.7)
Neutrophils Relative %: 66 %
Platelets: 251 10*3/uL (ref 150–400)
RBC: 4.06 MIL/uL (ref 3.87–5.11)
RDW: 12.2 % (ref 11.5–15.5)
WBC: 7.1 10*3/uL (ref 4.0–10.5)
nRBC: 0 % (ref 0.0–0.2)

## 2021-04-23 LAB — MAGNESIUM: Magnesium: 1.9 mg/dL (ref 1.7–2.4)

## 2021-04-23 LAB — TROPONIN I (HIGH SENSITIVITY): Troponin I (High Sensitivity): 2 ng/L (ref <18)

## 2021-04-23 LAB — I-STAT BETA HCG BLOOD, ED (MC, WL, AP ONLY): I-stat hCG, quantitative: <5 m[IU]/mL (ref <5)

## 2021-04-23 NOTE — ED Triage Notes (Signed)
Pt here via EMS from work. Pt states she was not feeling well. Laid down on floor and had seizure. Pt arrives, sleepy but alert and oriented X4 ?5mg  versed given EMS ? ?112/78 ?HR 78 ?98%RA ?CBG 129 ? ?20gLAC ? ?

## 2021-04-23 NOTE — ED Provider Notes (Signed)
?[facility] EMERGENCY DEPARTMENT ?Provider Note ? ? ?CSN: [id number] ?Arrival date & time: 04/23/21  1028 ? ?  ? ?History ? ?Chief Complaint  ?Patient presents with  ? Seizures  ? ? ?Angel Carpenter is a 36 y.o. female. ? ?36 yo F with a chief complaints of an episode that was thought to be seizure-like activity.  The patient was at work and did not feel well.  Been having some left-sided sharp chest pain off and on.  Times this goes down her arm.  Nothing seem to make that better or worse.  She had gone into a coworker's office where they have a place where he laid down and she tried to lay down and ended up waking up with EMS on top of her.  Reportedly had some shaking events.  Was given Versed also reportedly by EMS.  Patient still having some chest discomfort and does not feel well generally.  She is on Lexapro and hydroxyzine.  Denies any recent medication changes.  Denies any over-the-counter medications. ? ? ?Seizures ? ?  ? ?Home Medications ?Prior to Admission medications   ?Medication Sig Start Date End Date Taking? Authorizing Provider  ?escitalopram (LEXAPRO) 10 MG tablet Take 1 tablet (10 mg total) by mouth daily. 12/10/20  Yes Karsten Ro, MD  ?hydrOXYzine (ATARAX/VISTARIL) 25 MG tablet Take 1 tablet (25 mg total) by mouth 3 (three) times daily as needed (anxiety/agitation or CIWA < or = 10). ?Patient taking differently: Take 25 mg by mouth 3 (three) times daily as needed (anxiety/agitation). 12/09/20  Yes Karsten Ro, MD  ?melatonin 3 MG TABS tablet Take 2 tablets (6 mg total) by mouth at bedtime as needed. ?Patient not taking: Reported on 04/23/2021 12/09/20   Karsten Ro, MD  ?   ? ?Allergies    ?Sulfa antibiotics   ? ?Review of Systems   ?Review of Systems  ?Neurological:  Positive for seizures.  ? ?Physical Exam ?Updated Vital Signs ?BP 115/90   Pulse 62   Temp (!) 97.5 ?F (36.4 ?C) (Oral)   Resp 17   SpO2 98%  ?Physical Exam ?Vitals and nursing note reviewed.   ?Constitutional:   ?   General: She is not in acute distress. ?   Appearance: She is well-developed. She is not diaphoretic.  ?HENT:  ?   Head: Normocephalic and atraumatic.  ?Eyes:  ?   Pupils: Pupils are equal, round, and reactive to light.  ?Cardiovascular:  ?   Rate and Rhythm: Normal rate and regular rhythm.  ?   Heart sounds: No murmur heard. ?  No friction rub. No gallop.  ?Pulmonary:  ?   Effort: Pulmonary effort is normal.  ?   Breath sounds: No wheezing or rales.  ?Abdominal:  ?   General: There is no distension.  ?   Palpations: Abdomen is soft.  ?   Tenderness: There is no abdominal tenderness.  ?Musculoskeletal:     ?   General: Tenderness present.  ?   Cervical back: Normal range of motion and neck supple.  ?   Comments: Pain with palpation of the left anterior chest wall reproduces her tenderness.  ?Skin: ?   General: Skin is warm and dry.  ?Neurological:  ?   Mental Status: She is alert and oriented to person, place, and time.  ?Psychiatric:     ?   Behavior: Behavior normal.  ? ? ?ED Results / Procedures / Treatments   ?Labs ?(all labs ordered are listed, but  only abnormal results are displayed) ?Labs Reviewed  ?CBC WITH DIFFERENTIAL/PLATELET  ?BASIC METABOLIC PANEL  ?MAGNESIUM  ?CBG MONITORING, ED  ?I-STAT BETA HCG BLOOD, ED ([facility], [facility], [facility] ONLY)  ?TROPONIN I (HIGH SENSITIVITY)  ? ? ?EKG ?EKG Interpretation ? ?Date/Time:  Friday April 23 2021 10:36:08 EDT ?Ventricular Rate:  60 ?PR Interval:  130 ?QRS Duration: 94 ?QT Interval:  415 ?QTC Calculation: 415 ?R Axis:   74 ?Text Interpretation: Sinus rhythm No significant change since last tracing Confirmed by Melene Plan [phone]) on 04/23/2021 10:59:11 AM ? ?Radiology ?DG Chest Port 1 View ? ?Result Date: 04/23/2021 ?CLINICAL DATA:  Chest pain. EXAM: PORTABLE CHEST 1 VIEW COMPARISON:  January 18, 2017. FINDINGS: The heart size and mediastinal contours are within normal limits. Both lungs are clear. The visualized skeletal structures are unremarkable.  IMPRESSION: No active disease. Electronically Signed   By: Lupita Raider M.D.   On: 04/23/2021 11:47   ? ?Procedures ?Procedures  ? ? ?Medications Ordered in ED ?Medications - No data to display ? ?ED Course/ Medical Decision Making/ A&P ?  ?                        ?Medical Decision Making ?Amount and/or Complexity of Data Reviewed ?Labs: ordered. ?Radiology: ordered. ? ? ?36 yo F with a chief complaint of an event where she lost consciousness.  She had some shaking per bystanders and so there is some concern for seizure-like activity.  She is awake and alert now.  No history of seizure activity.  By history it sounds more like a vasovagal event if the patient was not feeling well and then was able to lay down prior to losing consciousness.  With her having some chest discomfort we will obtain a laboratory evaluation.  Chest x-ray.  Chest pain is atypical and reproduced on exam.  Observe in the ED.  Reassess. ? ?The patient is PERC negative. ? ?Labwork unremarkable.  No significant anemia no significant electrolyte abnormality.  Magnesium is normal.  The patient has no acidosis which makes it a bit less likely that the patient had a seizure.  Patient feeling better.  I think more likely syncope versus seizure.  We will have her follow-up with her family doctor in the office. ? ?1:34 PM:  I have discussed the diagnosis/risks/treatment options with the patient.  Evaluation and diagnostic testing in the emergency department does not suggest an emergent condition requiring admission or immediate intervention beyond what has been performed at this time.  They will follow up with  PCP. We also discussed returning to the ED immediately if new or worsening sx occur. We discussed the sx which are most concerning (e.g., sudden worsening pain, fever, inability to tolerate by mouth) that necessitate immediate return. Medications administered to the patient during their visit and any new prescriptions provided to the patient  are listed below. ? ?Medications given during this visit ?Medications - No data to display ? ? ?The patient appears reasonably screen and/or stabilized for discharge and I doubt any other medical condition or other [facility] requiring further screening, evaluation, or treatment in the ED at this time prior to discharge.  ? ? ? ? ? ? ? ? ?Final Clinical Impression(s) / ED Diagnoses ?Final diagnoses:  ?Syncope and collapse  ? ? ?Rx / DC Orders ?ED Discharge Orders   ? ? None  ? ?  ? ? ?  ?Melene Plan, DO ?04/23/21 1334 ? ?

## 2021-04-23 NOTE — Discharge Instructions (Signed)
Eat and drink as well as you can for the next 48 hours.  Please follow-up with your family doctor in the office.  Please return for worsening or recurrent symptoms.  Worsening or persistent chest pain. ?

## 2021-08-09 ENCOUNTER — Other Ambulatory Visit: Payer: Self-pay

## 2021-08-09 ENCOUNTER — Encounter (HOSPITAL_BASED_OUTPATIENT_CLINIC_OR_DEPARTMENT_OTHER): Payer: Self-pay | Admitting: Emergency Medicine

## 2021-08-09 ENCOUNTER — Emergency Department (HOSPITAL_BASED_OUTPATIENT_CLINIC_OR_DEPARTMENT_OTHER)
Admission: EM | Admit: 2021-08-09 | Discharge: 2021-08-09 | Disposition: A | Discharge status: Home / Self Care | Payer: BC Managed Care – PPO | Attending: Emergency Medicine | Admitting: Emergency Medicine

## 2021-08-09 DIAGNOSIS — F329 Major depressive disorder, single episode, unspecified: Secondary | ICD-10-CM | POA: Insufficient documentation

## 2021-08-09 DIAGNOSIS — R21 Rash and other nonspecific skin eruption: Secondary | ICD-10-CM | POA: Diagnosis present

## 2021-08-09 DIAGNOSIS — I10 Essential (primary) hypertension: Secondary | ICD-10-CM | POA: Diagnosis not present

## 2021-08-09 DIAGNOSIS — L550 Sunburn of first degree: Secondary | ICD-10-CM | POA: Insufficient documentation

## 2021-08-09 NOTE — ED Provider Notes (Signed)
[facility] EMERGENCY DEPARTMENT Provider Note   CSN: [id number] Arrival date & time: 08/09/21  0908     History  Chief Complaint  Patient presents with   Rash    Angel Carpenter is a 36 y.o. female with medical history of hypertension and depression.  Presents the emergency department with a chief complaint of sunburn.  Patient states that yesterday she was outside for approximately 3 hours.  Patient reports that she put on sunscreen before being outside however did not reapply any.  Patient states that last night after taking shower she noticed that she had sunburn to her back, bilateral upper and lower extremities.  Patient states that the sunburn was painful.  Patient noticed a fever of 100.5 degrees orally yesterday evening.  Patient denies any nausea, vomiting, abdominal pain, headache, wounds, blisters, chest pain, shortness of breath.   Rash Associated symptoms: fever   Associated symptoms: no abdominal pain, no headaches, no nausea, no shortness of breath and not vomiting        Home Medications Prior to Admission medications   Medication Sig Start Date End Date Taking? Authorizing Provider  escitalopram (LEXAPRO) 10 MG tablet Take 1 tablet (10 mg total) by mouth daily. 12/10/20   Karsten Ro, MD  hydrOXYzine (ATARAX/VISTARIL) 25 MG tablet Take 1 tablet (25 mg total) by mouth 3 (three) times daily as needed (anxiety/agitation or CIWA < or = 10). Patient taking differently: Take 25 mg by mouth 3 (three) times daily as needed (anxiety/agitation). 12/09/20   Karsten Ro, MD  melatonin 3 MG TABS tablet Take 2 tablets (6 mg total) by mouth at bedtime as needed. Patient not taking: Reported on 04/23/2021 12/09/20   Karsten Ro, MD      Allergies    Sulfa antibiotics    Review of Systems   Review of Systems  Constitutional:  Positive for fever. Negative for chills.  Respiratory:  Negative for shortness of breath.   Cardiovascular:  Negative for chest pain.   Gastrointestinal:  Negative for abdominal pain, nausea and vomiting.  Genitourinary:  Negative for difficulty urinating.  Musculoskeletal:  Negative for back pain and neck pain.  Skin:  Positive for rash. Negative for color change, pallor and wound.  Neurological:  Negative for dizziness, syncope, light-headedness and headaches.  Psychiatric/Behavioral:  Negative for confusion.     Physical Exam Updated Vital Signs BP 125/84 (BP Location: Right Arm)   Pulse 89   Temp 98.1 F (36.7 C) (Oral)   Resp 20   Ht 5\' 8"  (1.727 m)   Wt 74.8 kg   SpO2 100%   BMI 25.09 kg/m  Physical Exam Vitals and nursing note reviewed.  Constitutional:      General: She is not in acute distress.    Appearance: She is not ill-appearing, toxic-appearing or diaphoretic.  HENT:     Head: Normocephalic.  Eyes:     General: No scleral icterus.       Right eye: No discharge.        Left eye: No discharge.  Cardiovascular:     Rate and Rhythm: Normal rate.  Pulmonary:     Effort: Pulmonary effort is normal.  Skin:    General: Skin is warm and dry.     Findings: Burn present. No rash. Rash is not macular, nodular, purpuric, pustular, scaling, urticarial or vesicular.     Comments: Dry erythematous skin without blistering noted to bilateral upper and lower extremities and patient's back.  Clear delineation where patient  was wearing clothing yesterday.  Erythema is blanching.  Neurological:     General: No focal deficit present.     Mental Status: She is alert.  Psychiatric:        Behavior: Behavior is cooperative.     ED Results / Procedures / Treatments   Labs (all labs ordered are listed, but only abnormal results are displayed) Labs Reviewed - No data to display  EKG None  Radiology No results found.  Procedures Procedures    Medications Ordered in ED Medications - No data to display  ED Course/ Medical Decision Making/ A&P                           Medical Decision  Making  Alert 36 year old female no acute distress, nontoxic-appearing.  Presents the emergency department for complaint of sunburn.  Information obtained from patient.  I reviewed patient's past medical records including previous bladder notes, labs, and imaging.  Patient has medical history as outlined in HPI which complicates her care.  Patient has superficial thickness burn to bilateral upper and lower extremities and her back consistent with a sunburn.  Patient vital signs are within normal limits and patient has no other symptoms at this time.  Discussed symptomatic treatment with NSAIDs, calamine lotion, and the importance of oral hydration.  Patient to follow-up with PCP as needed.  Based on patient's chief complaint, I considered admission might be necessary, however after reassuring ED workup feel patient is reasonable for discharge.  Discussed results, findings, treatment and follow up. Patient advised of return precautions. Patient verbalized understanding and agreed with plan.  Portions of this note were generated with Scientist, clinical (histocompatibility and immunogenetics). Dictation errors may occur despite best attempts at proofreading.         Final Clinical Impression(s) / ED Diagnoses Final diagnoses:  None    Rx / DC Orders ED Discharge Orders     None         Haskel Schroeder, PA-C 08/09/21 0958    Melene Plan, DO 08/09/21 1030

## 2021-08-09 NOTE — Discharge Instructions (Addendum)
Please take Tylenol and ibuprofen as indicated below.  Please stay well-hydrated.  You may use aloe or calamine lotion to help with the discomfort of your sunburn.  Please follow-up with your primary care doctor as needed.  Please take Ibuprofen (Advil, motrin) and Tylenol (acetaminophen) to relieve your pain.    You may take up to 600 MG (3 pills) of normal strength ibuprofen every 8 hours as needed.   You make take tylenol, up to 1,000 mg (two extra strength pills) every 8 hours as needed.   It is safe to take ibuprofen and tylenol at the same time as they work differently.   Do not take more than 3,000 mg tylenol in a 24 hour period (not more than one dose every 8 hours.  Please check all medication labels as many medications such as pain and cold medications may contain tylenol.  Do not drink alcohol while taking these medications.  Do not take other NSAID'S while taking ibuprofen (such as aleve or naproxen).  Please take ibuprofen with food to decrease stomach upset.   Get help right away if: You are dizzy or you pass out. You have a severe headache or you feel confused. You vomit or have diarrhea. You develop severe blistering. You have pus or fluid coming from the blisters.

## 2021-08-09 NOTE — ED Triage Notes (Addendum)
Sunburn on legs arm and chest  and back from mowing grass yesterday  only took tylenol last night she states , felt she was runnig a fever

## 2021-08-09 NOTE — ED Notes (Signed)
Discharge instructions reviewed with patient. Patient verbalizes understanding, no further questions at this time. Medications and follow up information provided. No acute distress noted at time of departure.  

## 2021-12-22 ENCOUNTER — Other Ambulatory Visit: Payer: Self-pay

## 2021-12-22 ENCOUNTER — Emergency Department (HOSPITAL_COMMUNITY): Payer: Medicaid Other

## 2021-12-22 ENCOUNTER — Observation Stay (HOSPITAL_COMMUNITY)
Admission: EM | Admit: 2021-12-22 | Discharge: 2021-12-23 | DRG: 101 | Disposition: A | Discharge status: Home / Self Care | Payer: Medicaid Other | Attending: Internal Medicine | Admitting: Internal Medicine

## 2021-12-22 DIAGNOSIS — F4310 Post-traumatic stress disorder, unspecified: Secondary | ICD-10-CM | POA: Diagnosis present

## 2021-12-22 DIAGNOSIS — Z833 Family history of diabetes mellitus: Secondary | ICD-10-CM

## 2021-12-22 DIAGNOSIS — F332 Major depressive disorder, recurrent severe without psychotic features: Secondary | ICD-10-CM | POA: Diagnosis not present

## 2021-12-22 DIAGNOSIS — F1210 Cannabis abuse, uncomplicated: Secondary | ICD-10-CM | POA: Diagnosis not present

## 2021-12-22 DIAGNOSIS — Z87828 Personal history of other (healed) physical injury and trauma: Secondary | ICD-10-CM | POA: Diagnosis not present

## 2021-12-22 DIAGNOSIS — Z79899 Other long term (current) drug therapy: Secondary | ICD-10-CM | POA: Diagnosis not present

## 2021-12-22 DIAGNOSIS — F17210 Nicotine dependence, cigarettes, uncomplicated: Secondary | ICD-10-CM | POA: Diagnosis present

## 2021-12-22 DIAGNOSIS — F1310 Sedative, hypnotic or anxiolytic abuse, uncomplicated: Secondary | ICD-10-CM | POA: Diagnosis not present

## 2021-12-22 DIAGNOSIS — R569 Unspecified convulsions: Secondary | ICD-10-CM | POA: Diagnosis not present

## 2021-12-22 DIAGNOSIS — I10 Essential (primary) hypertension: Secondary | ICD-10-CM | POA: Diagnosis not present

## 2021-12-22 DIAGNOSIS — Z882 Allergy status to sulfonamides status: Secondary | ICD-10-CM

## 2021-12-22 LAB — COMPREHENSIVE METABOLIC PANEL
ALT: 15 U/L (ref 0–44)
AST: 18 U/L (ref 15–41)
Albumin: 3.8 g/dL (ref 3.5–5.0)
Alkaline Phosphatase: 50 U/L (ref 38–126)
Anion gap: 5 (ref 5–15)
BUN: 13 mg/dL (ref 6–20)
CO2: 24 mmol/L (ref 22–32)
Calcium: 8.8 mg/dL — ABNORMAL LOW (ref 8.9–10.3)
Chloride: 107 mmol/L (ref 98–111)
Creatinine, Ser: 0.68 mg/dL (ref 0.44–1.00)
GFR, Estimated: >60 mL/min (ref >60)
Glucose, Bld: 97 mg/dL (ref 70–99)
Potassium: 3.9 mmol/L (ref 3.5–5.1)
Sodium: 136 mmol/L (ref 135–145)
Total Bilirubin: 0.4 mg/dL (ref 0.3–1.2)
Total Protein: 6.9 g/dL (ref 6.5–8.1)

## 2021-12-22 LAB — RAPID URINE DRUG SCREEN, HOSP PERFORMED
Amphetamines: NOT DETECTED
Barbiturates: NOT DETECTED
Benzodiazepines: NOT DETECTED
Cocaine: NOT DETECTED
Opiates: NOT DETECTED
Tetrahydrocannabinol: POSITIVE — AB

## 2021-12-22 LAB — CBG MONITORING, ED: Glucose-Capillary: 98 mg/dL (ref 70–99)

## 2021-12-22 LAB — CBC WITH DIFFERENTIAL/PLATELET
Abs Immature Granulocytes: 0.03 10*3/uL (ref 0.00–0.07)
Basophils Absolute: 0.1 10*3/uL (ref 0.0–0.1)
Basophils Relative: 1 %
Eosinophils Absolute: 0.1 10*3/uL (ref 0.0–0.5)
Eosinophils Relative: 2 %
HCT: 39.6 % (ref 36.0–46.0)
Hemoglobin: 13.7 g/dL (ref 12.0–15.0)
Immature Granulocytes: 0 %
Lymphocytes Relative: 32 %
Lymphs Abs: 2.9 10*3/uL (ref 0.7–4.0)
MCH: 31.3 pg (ref 26.0–34.0)
MCHC: 34.6 g/dL (ref 30.0–36.0)
MCV: 90.4 fL (ref 80.0–100.0)
Monocytes Absolute: 0.4 10*3/uL (ref 0.1–1.0)
Monocytes Relative: 5 %
Neutro Abs: 5.6 10*3/uL (ref 1.7–7.7)
Neutrophils Relative %: 60 %
Platelets: 219 10*3/uL (ref 150–400)
RBC: 4.38 MIL/uL (ref 3.87–5.11)
RDW: 12 % (ref 11.5–15.5)
WBC: 9.2 10*3/uL (ref 4.0–10.5)
nRBC: 0 % (ref 0.0–0.2)

## 2021-12-22 LAB — LACTIC ACID, PLASMA: Lactic Acid, Venous: 0.8 mmol/L (ref 0.5–1.9)

## 2021-12-22 LAB — HCG, QUANTITATIVE, PREGNANCY: hCG, Beta Chain, Quant, S: <1 m[IU]/mL (ref <5)

## 2021-12-22 LAB — ACETAMINOPHEN LEVEL: Acetaminophen (Tylenol), Serum: <10 ug/mL — ABNORMAL LOW (ref 10–30)

## 2021-12-22 LAB — ETHANOL: Alcohol, Ethyl (B): <10 mg/dL (ref <10)

## 2021-12-22 LAB — SALICYLATE LEVEL: Salicylate Lvl: <7 mg/dL — ABNORMAL LOW (ref 7.0–30.0)

## 2021-12-22 MED ORDER — ENOXAPARIN SODIUM 40 MG/0.4ML IJ SOSY
40.0000 mg | PREFILLED_SYRINGE | INTRAMUSCULAR | Status: DC
  Administered 2021-12-23: 40 mg via SUBCUTANEOUS
  Filled 2021-12-22: qty 0.4

## 2021-12-22 MED ORDER — ACETAMINOPHEN 325 MG PO TABS
650.0000 mg | ORAL_TABLET | Freq: Four times a day (QID) | ORAL | Status: DC | PRN
  Administered 2021-12-23: 650 mg via ORAL
  Filled 2021-12-22: qty 2

## 2021-12-22 MED ORDER — SENNOSIDES-DOCUSATE SODIUM 8.6-50 MG PO TABS: 1.0000 | ORAL_TABLET | Freq: Every evening | ORAL | Status: DC | PRN

## 2021-12-22 MED ORDER — ONDANSETRON HCL 4 MG/2ML IJ SOLN: 4.0000 mg | Freq: Four times a day (QID) | INTRAMUSCULAR | Status: DC | PRN

## 2021-12-22 MED ORDER — LORAZEPAM 2 MG/ML IJ SOLN: 2.0000 mg | Freq: Once | INTRAMUSCULAR | Status: DC | PRN

## 2021-12-22 MED ORDER — ACETAMINOPHEN 650 MG RE SUPP: 650.0000 mg | Freq: Four times a day (QID) | RECTAL | Status: DC | PRN

## 2021-12-22 MED ORDER — ONDANSETRON HCL 4 MG PO TABS: 4.0000 mg | ORAL_TABLET | Freq: Four times a day (QID) | ORAL | Status: DC | PRN

## 2021-12-22 NOTE — ED Provider Notes (Signed)
[facility]-EMERGENCY DEPT Provider Note   CSN: [id number] Arrival date & time: 12/22/21  1720     History  No chief complaint on file.   Angel Carpenter is a 36 y.o. female with PTSD, MDD, marijuana and benzodiazepine use who presents with seizure.  Patient  Reports seizure activity today as well as Monday. It was witnessed by her two young children both times.  On Monday patient stated that she felt normal and then woke up on the ground.  Her young child called 911 but she can come to the hospital because she had nobody want her children.  Today it occurred again, she was walking in the hallway and next and she knew she was waking up.  This is the patient's third episode of seizure-like activity, 1 occurred in April of this year and she presented to the hospital but they told her that it potentially was not a seizure after all.  Patient does not take any medications.  She used to take Wellbutrin and Klonopin but was removed from them last year and states that she has had an emotionally hard year. Patient stated that she has had significant distress over the course of this year.  Her husband moved out of the house and is got a new girlfriend.  She also found out 2 days ago that she will need to sell her home and moved to a new location.  Patient denies any alcohol or drug abuse.  Denies any pain at this time but states that when she woke up in the seizure her left hand felt like it was cramping.  That is since subsided.  She otherwise has had no recent illnesses, fevers chills, chest pain, shortness of breath, nausea vomiting constipation, urinary symptoms.  Endorses recent diarrhea but states that that happens when she is stressed. Is not pregnant and has not recently given birth.  HPI     Home Medications Prior to Admission medications   Medication Sig Start Date End Date Taking? Authorizing Provider  escitalopram (LEXAPRO) 10 MG tablet Take 1 tablet (10 mg total) by  mouth daily. 12/10/20   Karsten Ro, MD  hydrOXYzine (ATARAX/VISTARIL) 25 MG tablet Take 1 tablet (25 mg total) by mouth 3 (three) times daily as needed (anxiety/agitation or CIWA < or = 10). Patient taking differently: Take 25 mg by mouth 3 (three) times daily as needed (anxiety/agitation). 12/09/20   Karsten Ro, MD  melatonin 3 MG TABS tablet Take 2 tablets (6 mg total) by mouth at bedtime as needed. Patient not taking: Reported on 04/23/2021 12/09/20   Karsten Ro, MD      Allergies    Sulfa antibiotics    Review of Systems   Review of Systems Review of systems Negative for f/c.  A 10 point review of systems was performed and is negative unless otherwise reported in HPI.  Physical Exam Updated Vital Signs BP 134/83 (BP Location: Left Arm)   Pulse 82   Temp (!) 97.5 F (36.4 C) (Axillary)   Resp 11   Ht 5\' 8"  (1.727 m)   Wt 71.2 kg   SpO2 98%   BMI 23.87 kg/m  Physical Exam General: Normal appearing female, lying in bed.  HEENT: PERRLA, EOMI, Sclera anicteric, MMM, trachea midline.  Cardiology: RRR, no murmurs/rubs/gallops. BL radial and DP pulses equal bilaterally.  Resp: Normal respiratory rate and effort. CTAB, no wheezes, rhonchi, crackles.  Abd: Soft, non-tender, non-distended. No rebound tenderness or guarding.  GU: Deferred. MSK: No  peripheral edema or signs of trauma. Extremities without deformity or TTP. No cyanosis or clubbing. Skin: warm, dry. No rashes or lesions. Back: No CVA tenderness Neuro: A&Ox4, CNs II-XII grossly intact. MAEs. Sensation grossly intact. Intact coordination, intact gait.  Normal speech. Tongue protrudes midline. Psych: Normal mood and affect.   ED Results / Procedures / Treatments   Labs (all labs ordered are listed, but only abnormal results are displayed) Labs Reviewed  COMPREHENSIVE METABOLIC PANEL - Abnormal; Notable for the following components:      Result Value   Calcium 8.8 (*)    All other components within normal limits   RAPID URINE DRUG SCREEN, HOSP PERFORMED - Abnormal; Notable for the following components:   Tetrahydrocannabinol POSITIVE (*)    All other components within normal limits  SALICYLATE LEVEL - Abnormal; Notable for the following components:   Salicylate Lvl <7.0 (*)    All other components within normal limits  ACETAMINOPHEN LEVEL - Abnormal; Notable for the following components:   Acetaminophen (Tylenol), Serum <10 (*)    All other components within normal limits  BASIC METABOLIC PANEL - Abnormal; Notable for the following components:   Calcium 8.8 (*)    All other components within normal limits  CBC - Abnormal; Notable for the following components:   RBC 3.42 (*)    Hemoglobin 10.9 (*)    HCT 33.8 (*)    All other components within normal limits  CBC WITH DIFFERENTIAL/PLATELET  ETHANOL  LACTIC ACID, PLASMA  HCG, QUANTITATIVE, PREGNANCY  CBG MONITORING, ED    EKG EKG Interpretation  Date/Time:  Wednesday December 22 2021 18:29:49 EST Ventricular Rate:  75 PR Interval:  125 QRS Duration: 91 QT Interval:  378 QTC Calculation: 423 R Axis:   74 Text Interpretation: Sinus rhythm Confirmed by Vivi Barrack [id number] on 12/22/2021 8:08:35 PM  Radiology  CTH: NAICP  Procedures Procedures    Medications Ordered in ED Medications - No data to display  ED Course/ Medical Decision Making/ A&P                          Medical Decision Making Amount and/or Complexity of Data Reviewed Labs: ordered. Decision-making details documented in ED Course. Radiology: ordered. Decision-making details documented in ED Course.  Risk Decision regarding hospitalization.   Patient is very well-appearing at this time and is HDS. No FNDs, A&Ox4.   Causes of seizures include:  Intracranial hemorrhage, brain tumor or abscess, head injury or TBI, hypoxic-ischemic injury, or underlying epilepsy, vascular malformation such as AVM, electrolyte abnormalities (hyponatremia/hypernatremia,  hypomagnesemia, hypocalcemia), hypoglycemia, uremia, hyperthyroidism, non-epileptic seizure. Low c/f alcohol/drug withdrawal as patient feels normal now with no treatment. Low c/f meningitis/encephalitis given no fevers/chills, concurrent symptoms, recent illnesses. Higher c/f PNES given patient's report of seizures when she is stressed and increased stress in her life lately. Will initiate seizure w/u. Placed on seizure precautions.    I have personally reviewed and interpreted all labs and imaging.  Patient was maintained on a cardiac monitor.  I have personally interpreted the telemetry as NSR.  For further updates please see ED course.  ED Course:  Clinical Course as of 12/29/21 1654  Wed Dec 22, 2021  2008 CT Head Wo Contrast No acute intracranial process. [HN]  2008 POC glucose, lactate, CBC, CMP, EtOH, beta hcg, acetaminophen/salicylate wnl [HN]  2023 D/w neurology. Recommend epilepsy monitoring unit and a rectal diazepam PRN for seizure, and no driving; vs admission. Will d/w  patient. [HN]  2051 D/w patient who stated that it would be easier to be admitted now since her children are already cared for for the evening. Messaged neurology. [HN]  2102 Tetrahydrocannabinol(!): POSITIVE [HN]    Clinical Course User Index [HN] Loetta Rough, MD   Dispo: Admit to neurology for EEG and MRI, seizure w/u.         Final Clinical Impression(s) / ED Diagnoses Final diagnoses:  Seizure-like activity (HCC)    Rx / DC Orders ED Discharge Orders     None        This note was created using dictation software, which may contain spelling or grammatical errors.    Loetta Rough, MD 12/29/21 [phone]

## 2021-12-22 NOTE — Discharge Instructions (Signed)
The recommended DMV regulation requirement for a driver in [place] for an individual with a seizure is that they be seizure-free for 6-12 months. However, the DMV may consider the following exceptions to this general rule where: (1) a physician-directed change in medication causes a seizure and the individual immediately resumes the previous therapy which controlled seizures; (2) there is a history of nocturnal seizures or seizures which do not involve loss of consciousness, loss of control of motor function, or loss of appropriate sensation and information process; and (3) an individual has a seizure disorder preceded by an aura (warning) lasting 2-3 minutes. While the [facility] may also give consideration to other unusual circumstances which may affect the general requirement that drivers be seizure-free for 6-12 months, interpretation of these circumstances and assignment of restrictions is at the discretion of the Medical Advisor. The DMV also considers compliance with medical therapy essential for safe driving. [facility] Physician's Guide to [employer] (June, 1995 ed.)] The Department learns of an individual's condition by inquiring on the application form or renewal form, a physician's report to the [facility], an accident report or from correspondence from the individual. The person may be required to submit a Medical Report Form either annually or semi-annually.

## 2021-12-22 NOTE — H&P (Incomplete)
PCP:   Nonnie Done., MD   Chief Complaint:  ***  HPI:  Occured momday, didn't come to hosp Recurred today, once today. One more back in April on her job. Has not seem MD No other sx, fever  HA, palp HA after sz occur, temporal region, right side No localized tx  Review of Systems:  The patient denies anorexia, fever, weight loss,, vision loss, decreased hearing, hoarseness, chest pain, syncope, dyspnea on exertion, peripheral edema, balance deficits, hemoptysis, abdominal pain, melena, hematochezia, severe indigestion/heartburn, hematuria, incontinence, genital sores, muscle weakness, suspicious skin lesions, transient blindness, difficulty walking, depression, unusual weight change, abnormal bleeding, enlarged lymph nodes, angioedema, and breast masses.  Past Medical History: Past Medical History:  Diagnosis Date   Depression    Hypertension    Past Surgical History:  Procedure Laterality Date   INNER EAR SURGERY     Left   NO PAST SURGERIES      Medications: Prior to Admission medications   Medication Sig Start Date End Date Taking? Authorizing Provider  escitalopram (LEXAPRO) 10 MG tablet Take 1 tablet (10 mg total) by mouth daily. 12/10/20   Karsten Ro, MD  hydrOXYzine (ATARAX/VISTARIL) 25 MG tablet Take 1 tablet (25 mg total) by mouth 3 (three) times daily as needed (anxiety/agitation or CIWA < or = 10). Patient taking differently: Take 25 mg by mouth 3 (three) times daily as needed (anxiety/agitation). 12/09/20   Karsten Ro, MD  melatonin 3 MG TABS tablet Take 2 tablets (6 mg total) by mouth at bedtime as needed. Patient not taking: Reported on 04/23/2021 12/09/20   Karsten Ro, MD    Allergies:   Allergies  Allergen Reactions   Sulfa Antibiotics Swelling    Mouth breaks out    Social History:  reports that she has been smoking. She has never used smokeless tobacco. She reports current drug use. She reports that she does not drink  alcohol.  Family History: Family History  Problem Relation Age of Onset   Diabetes Father     Physical Exam: Vitals:   12/22/21 1724 12/22/21 1728 12/22/21 2030  BP:  134/83 112/70  Pulse:  82 76  Resp:  11 15  Temp:  (!) 97.5 F (36.4 C) 99.4 F (37.4 C)  TempSrc:  Axillary Oral  SpO2:  98% 100%  Weight: 71.2 kg    Height: 5\' 8"  (1.727 m)      General:  Alert and oriented times three, well developed and nourished, no acute distress Eyes: PERRLA, pink conjunctiva, no scleral icterus ENT: Moist oral mucosa, neck supple, no thyromegaly Lungs: clear to ascultation, no wheeze, no crackles, no use of accessory muscles Cardiovascular: regular rate and rhythm, no regurgitation, no gallops, no murmurs. No carotid bruits, no JVD Abdomen: soft, positive BS, non-tender, non-distended, no organomegaly, not an acute abdomen GU: not examined Neuro: CN II - XII grossly intact, sensation intact Musculoskeletal: strength 5/5 all extremities, no clubbing, cyanosis or edema Skin: no rash, no subcutaneous crepitation, no decubitus Psych: appropriate patient   Labs on Admission:  Recent Labs    12/22/21 1836  NA 136  K 3.9  CL 107  CO2 24  GLUCOSE 97  BUN 13  CREATININE 0.68  CALCIUM 8.8*   Recent Labs    12/22/21 1836  AST 18  ALT 15  ALKPHOS 50  BILITOT 0.4  PROT 6.9  ALBUMIN 3.8   No results for input(s): "LIPASE", "AMYLASE" in the last 72 hours. Recent Labs  12/22/21 1836  WBC 9.2  NEUTROABS 5.6  HGB 13.7  HCT 39.6  MCV 90.4  PLT 219     Micro Results: No results found for this or any previous visit (from the past 240 hour(s)).   Radiological Exams on Admission: CT Head Wo Contrast  Result Date: 12/22/2021 CLINICAL DATA:  Seizure, new-onset, no history of trauma Head trauma, abnormal mental status (Age 19-64y) EXAM: CT HEAD WITHOUT CONTRAST TECHNIQUE: Contiguous axial images were obtained from the base of the skull through the vertex without  intravenous contrast. RADIATION DOSE REDUCTION: This exam was performed according to the departmental dose-optimization program which includes automated exposure control, adjustment of the mA and/or kV according to patient size and/or use of iterative reconstruction technique. COMPARISON:  12/21/2013 FINDINGS: Brain: No evidence of acute infarction, hemorrhage, hydrocephalus, extra-axial collection or mass lesion/mass effect. Vascular: No hyperdense vessel or unexpected calcification. Skull: Normal. Negative for fracture or focal lesion. Sinuses/Orbits: No acute finding. Mastoid opacification consistent with mastoiditis. IMPRESSION: No acute intracranial process. Electronically Signed   By: Layla Maw M.D.   On: 12/22/2021 19:23    Assessment/Plan Present on Admission: **None**  Anxiety/PTSD -notg on meds  ??HTN  Kindle Strohmeier 12/22/2021, 10:37 PM

## 2021-12-22 NOTE — ED Triage Notes (Signed)
Pt BIB EMS from home with c/o seizures today. She also had a seizure on Monday, maybe stress induced. Hx of anxiety and depression, and CF.  BP148/100 HR 94 98 RA CBG 101

## 2021-12-23 ENCOUNTER — Inpatient Hospital Stay (HOSPITAL_COMMUNITY): Payer: Medicaid Other

## 2021-12-23 DIAGNOSIS — F4310 Post-traumatic stress disorder, unspecified: Secondary | ICD-10-CM | POA: Diagnosis not present

## 2021-12-23 DIAGNOSIS — F332 Major depressive disorder, recurrent severe without psychotic features: Secondary | ICD-10-CM | POA: Diagnosis not present

## 2021-12-23 DIAGNOSIS — R569 Unspecified convulsions: Secondary | ICD-10-CM | POA: Diagnosis not present

## 2021-12-23 LAB — BASIC METABOLIC PANEL
Anion gap: 6 (ref 5–15)
BUN: 12 mg/dL (ref 6–20)
CO2: 25 mmol/L (ref 22–32)
Calcium: 8.8 mg/dL — ABNORMAL LOW (ref 8.9–10.3)
Chloride: 107 mmol/L (ref 98–111)
Creatinine, Ser: 0.58 mg/dL (ref 0.44–1.00)
GFR, Estimated: >60 mL/min (ref >60)
Glucose, Bld: 90 mg/dL (ref 70–99)
Potassium: 3.7 mmol/L (ref 3.5–5.1)
Sodium: 138 mmol/L (ref 135–145)

## 2021-12-23 LAB — CBC
HCT: 33.8 % — ABNORMAL LOW (ref 36.0–46.0)
Hemoglobin: 10.9 g/dL — ABNORMAL LOW (ref 12.0–15.0)
MCH: 31.9 pg (ref 26.0–34.0)
MCHC: 32.2 g/dL (ref 30.0–36.0)
MCV: 98.8 fL (ref 80.0–100.0)
Platelets: 184 10*3/uL (ref 150–400)
RBC: 3.42 MIL/uL — ABNORMAL LOW (ref 3.87–5.11)
RDW: 13.6 % (ref 11.5–15.5)
WBC: 8.3 10*3/uL (ref 4.0–10.5)
nRBC: 0 % (ref 0.0–0.2)

## 2021-12-23 MED ORDER — LAMOTRIGINE 25 MG PO TABS: ORAL_TABLET | ORAL | 0 refills | Status: DC

## 2021-12-23 MED ORDER — ESCITALOPRAM OXALATE 10 MG PO TABS
10.0000 mg | ORAL_TABLET | Freq: Every day | ORAL | Status: DC
  Filled 2021-12-23: qty 1

## 2021-12-23 MED ORDER — MELATONIN 3 MG PO TABS: 6.0000 mg | ORAL_TABLET | Freq: Every evening | ORAL | Status: DC | PRN

## 2021-12-23 NOTE — Discharge Summary (Signed)
Physician Discharge Summary   Patient: Angel Carpenter MRN: [id number] DOB: [birth year]  Admit date:     12/22/2021  Discharge date: 12/23/21  Discharge Physician: Rickey Barbara   PCP: Nonnie Done., MD   Recommendations at discharge:    Follow up with PCP in 1-2 weeks Follow up with [facility] Neurology as scheduled  Discharge Diagnoses: Principal Problem:   Seizure [facility]) Active Problems:   MDD (major depressive disorder), recurrent severe, without psychosis (HCC)   PTSD (post-traumatic stress disorder)   Marijuana abuse   Benzodiazepine abuse (HCC)  Resolved Problems:   * No resolved hospital problems. *  Hospital Course: 36 year old female with past medical history MDD without psychosis.  On 11/9 she was admitted to [facility] for benzo overdose.  She presents after she had a seizure.  This is her third seizure.  The first was back in April at her job.  She had another seizure on Monday did not come to the hospital.  Today she had a third seizure.  The patient is unable to provide history as she does not recall the events of surrounding her seizure today.  She does states she has not been ill, she denies fevers or chills or any localized weaknesses.  She denies any URI type symptoms.  She does endorse a headache and heart palpitation.  She states her headache occurs after her seizure.  Her headache is bitemporal. She denies history of migraines  Assessment and Plan: Seizures -Initial plan to transfer to [facility] for continuous EEG -While awaiting bed, had discussed with Neurology who recommended OK to remain at [facility] -Pt was seen by Neurology and had remained stable -Per Neurology, recs to taper up lamictal as per below regimen -Pt to follow up with Neurology as outpatient -Instructions provided for patient to not drive, provided in instructions -MRI brain neg    MDD (major depressive disorder), recurrent severe, without psychosis (HCC)  PTSD (post-traumatic stress disorder) -Stable, had  been on Lexapro    Marijuana abuse  Benzodiazepine abuse (HCC)   Elevated blood pressure readings -As needed blood pressure meds       Consultants: Neurology Procedures performed: MRI  Disposition: Home Diet recommendation:  Regular diet DISCHARGE MEDICATION: Allergies as of 12/23/2021       Reactions   Sulfa Antibiotics Swelling   Mouth breaks out        Medication List     TAKE these medications    acetaminophen 500 MG tablet Commonly known as: TYLENOL Take 1,000 mg by mouth every 6 (six) hours as needed for mild pain or headache.   escitalopram 10 MG tablet Commonly known as: LEXAPRO Take 1 tablet (10 mg total) by mouth daily.   lamoTRIgine 25 MG tablet Commonly known as: LaMICtal Take 1 tablet (25 mg total) by mouth at bedtime for 14 days, THEN 1 tablet (25 mg total) 2 (two) times daily for 14 days. Start taking on: December 23, 2021   lamoTRIgine 25 MG tablet Commonly known as: LaMICtal Take 1 tablet (25 mg total) by mouth daily AND 2 tablets (50 mg total) at bedtime. Do all this for 7 days. Start taking on: January 20, 2022   lamoTRIgine 25 MG tablet Commonly known as: LaMICtal Take 2 tablets (50 mg total) by mouth daily AND 3 tablets (75 mg total) at bedtime. Do all this for 7 days. Start taking on: January 27, 2022   lamoTRIgine 25 MG tablet Commonly known as: LaMICtal Take 3 tablets (75 mg total) by mouth  daily AND 4 tablets (100 mg total) at bedtime. Do all this for 7 days. Start taking on: February 03, 2022   lamoTRIgine 25 MG tablet Commonly known as: LaMICtal Take 4 tablets (100 mg total) by mouth daily AND 5 tablets (125 mg total) at bedtime. Do all this for 7 days. Start taking on: February 10, 2022   Melatonin 5 MG Chew Chew 5 mg by mouth daily as needed (sleep).        Follow-up Information     Slatosky, Excell Seltzer., MD Follow up in 2 week(s).   Specialty: Family Medicine Why: Hospital follow up Contact information: [address] [place] [address] [phone]                Discharge Exam: Ceasar Mons Weights   12/22/21 1724  Weight: 71.2 kg   General exam: Conversant, in no acute distress Respiratory system: normal chest rise, clear, no audible wheezing Cardiovascular system: regular rhythm, s1-s2 Gastrointestinal system: Nondistended, nontender, pos BS Central nervous system: No seizures, no tremors Extremities: No cyanosis, no joint deformities Skin: No rashes, no pallor Psychiatry: Affect normal // no auditory hallucinations   Condition at discharge: fair  The results of significant diagnostics from this hospitalization (including imaging, microbiology, ancillary and laboratory) are listed below for reference.   Imaging Studies: MR BRAIN WO CONTRAST  Result Date: 12/23/2021 CLINICAL DATA:  Seizure, new onset. Admitted for benzodiazepine overdose EXAM: MRI HEAD WITHOUT CONTRAST TECHNIQUE: Multiplanar, multiecho pulse sequences of the brain and surrounding structures were obtained without intravenous contrast. COMPARISON:  Head CT from yesterday FINDINGS: Brain: No cortical finding to correlate with seizure history. Symmetric normal appearance of the hippocampus. No acute infarct, hemorrhage, hydrocephalus, mass, or collection. No brain atrophy. Small FLAIR hyperintensities in the cerebral white matter without specific pattern. These are limited in extent. These could be related to patient's history of hypertension. Prior inflammation, trauma, or migraine can give a similar appearance. Vascular: Normal flow voids. Skull and upper cervical spine: Normal marrow signal. Sinuses/Orbits: Right more than left mastoid opacification with negative nasopharynx. IMPRESSION: No acute finding or visible seizure focus. Few remote white matter insults with nonspecific pattern, possibly related to chart history of hypertension. Right more than left mastoid opacification. Electronically Signed   By: Tiburcio Pea M.D.    On: 12/23/2021 06:48   CT Head Wo Contrast  Result Date: 12/22/2021 CLINICAL DATA:  Seizure, new-onset, no history of trauma Head trauma, abnormal mental status (Age 58-64y) EXAM: CT HEAD WITHOUT CONTRAST TECHNIQUE: Contiguous axial images were obtained from the base of the skull through the vertex without intravenous contrast. RADIATION DOSE REDUCTION: This exam was performed according to the departmental dose-optimization program which includes automated exposure control, adjustment of the mA and/or kV according to patient size and/or use of iterative reconstruction technique. COMPARISON:  12/21/2013 FINDINGS: Brain: No evidence of acute infarction, hemorrhage, hydrocephalus, extra-axial collection or mass lesion/mass effect. Vascular: No hyperdense vessel or unexpected calcification. Skull: Normal. Negative for fracture or focal lesion. Sinuses/Orbits: No acute finding. Mastoid opacification consistent with mastoiditis. IMPRESSION: No acute intracranial process. Electronically Signed   By: Layla Maw M.D.   On: 12/22/2021 19:23    Microbiology: Results for orders placed or performed during the hospital encounter of 12/03/20  Resp Panel by RT-PCR (Flu A&B, Covid) Nasopharyngeal Swab     Status: None   Collection Time: 12/03/20  8:47 PM   Specimen: Nasopharyngeal Swab; Nasopharyngeal(NP) swabs in vial transport medium  Result Value Ref  Range Status   SARS Coronavirus 2 by RT PCR NEGATIVE NEGATIVE Final    Comment: (NOTE) SARS-CoV-2 target nucleic acids are NOT DETECTED.  The SARS-CoV-2 RNA is generally detectable in upper respiratory specimens during the acute phase of infection. The lowest concentration of SARS-CoV-2 viral copies this assay can detect is 138 copies/mL. A negative result does not preclude SARS-Cov-2 infection and should not be used as the sole basis for treatment or other patient management decisions. A negative result may occur with  improper specimen  collection/handling, submission of specimen other than nasopharyngeal swab, presence of viral mutation(s) within the areas targeted by this assay, and inadequate number of viral copies(<138 copies/mL). A negative result must be combined with clinical observations, patient history, and epidemiological information. The expected result is Negative.  Fact Sheet for Patients:  BloggerCourse.com  Fact Sheet for Healthcare Providers:  SeriousBroker.it  This test is no t yet approved or cleared by the [place] FDA and  has been authorized for detection and/or diagnosis of SARS-CoV-2 by FDA under an Emergency Use Authorization (EUA). This EUA will remain  in effect (meaning this test can be used) for the duration of the COVID-19 declaration under Section 564(b)(1) of the Act, 21 U.S.C.section 360bbb-3(b)(1), unless the authorization is terminated  or revoked sooner.       Influenza A by PCR NEGATIVE NEGATIVE Final   Influenza B by PCR NEGATIVE NEGATIVE Final    Comment: (NOTE) The Xpert Xpress SARS-CoV-2/FLU/RSV plus assay is intended as an aid in the diagnosis of influenza from Nasopharyngeal swab specimens and should not be used as a sole basis for treatment. Nasal washings and aspirates are unacceptable for Xpert Xpress SARS-CoV-2/FLU/RSV testing.  Fact Sheet for Patients: BloggerCourse.com  Fact Sheet for Healthcare Providers: SeriousBroker.it  This test is not yet approved or cleared by the [place] FDA and has been authorized for detection and/or diagnosis of SARS-CoV-2 by FDA under an Emergency Use Authorization (EUA). This EUA will remain in effect (meaning this test can be used) for the duration of the COVID-19 declaration under Section 564(b)(1) of the Act, 21 U.S.C. section 360bbb-3(b)(1), unless the authorization is terminated or revoked.  Performed at [facility], [address] [place], [place] [address]     Labs: CBC: Recent Labs  Lab 12/22/21 1836 12/23/21 0441  WBC 9.2 8.3  NEUTROABS 5.6  --   HGB 13.7 10.9*  HCT 39.6 33.8*  MCV 90.4 98.8  PLT 219 184   Basic Metabolic Panel: Recent Labs  Lab 12/22/21 1836 12/23/21 0441  NA 136 138  K 3.9 3.7  CL 107 107  CO2 24 25  GLUCOSE 97 90  BUN 13 12  CREATININE 0.68 0.58  CALCIUM 8.8* 8.8*   Liver Function Tests: Recent Labs  Lab 12/22/21 1836  AST 18  ALT 15  ALKPHOS 50  BILITOT 0.4  PROT 6.9  ALBUMIN 3.8   CBG: Recent Labs  Lab 12/22/21 1829  GLUCAP 98    Discharge time spent: less than 30 minutes.  Signed: Rickey Barbara, MD [facility] 12/23/2021

## 2021-12-23 NOTE — Care Management (Signed)
MATCH for medication assistance, called to pharmacy  [facility] Medication Assistance Card Name: Angel Carpenter ID: [id number] Bin: [id number] RX Group: [id number] Discharge Date:  12/23/2021 Expiration Date:  12/30/2021 (must be filled within 7 days of discharge)

## 2021-12-23 NOTE — Consult Note (Signed)
Neurology Consultation  Reason for Consult: seizure activity Referring Physician: Dr. Rhona Leavens  CC: seizures  History is obtained from:patient and chart  HPI: Angel Carpenter is a 36 y.o. female with history of depression who presents after having 2 seizures at home.  Patient had a seizure yesterday, which consisted of patient feeling unwell and dizzy, then rolling her eyes up into the back of her head and shaking all over.  She felt somewhat confused afterwards.  She has no memory of the episode, and description of the seizure was given to her by her daughter who contacted EMS.  Patient had another seizure on Monday, and states that she had a seizure in April while she was at work, but did not seek medical attention for the first 2 seizures.  She reports that the event in April her coworker told her she had generalized shaking activity for 6 to 7 minutes.  Regarding risk factors: She reports that no one in her family has had seizures, that she has a history of a concussion at the age of 84 and that she passed out several days after this concussion, likely due to pain medication she was given, she has no history of meningitis or encephalitis, no febrile seizures as a child, no problems with her birth or developmental delays.    There was some concern that benzodiazepine withdrawal may have precipitated the seizures, but patient states she has not taken benzodiazepines for a year.  She was [facility] to [facility] year in November with depression and benzo overdose and states that she has not used benzodiazepines since then.  She denies alcohol or other drug use, but UDS was positive for THC. She was also on Wellbutrin at that time, but but stopped taking the medication during that hospitalization.  Patient states that she has felt increased stress recently and that she has had some bowel irregularity due to the stress.  She denies recent illness, fever or other symptoms.  She provides  a slightly different history to attending MD stating that she did not have any warning of the event yesterday or Monday, but she did have a headache before the event in April (actually presented to the ED 3/31).  On review of the records, on ED providers evaluation they were not certain this was a generalized tonic-clonic seizure but may have been a vasovagal event with post syncopal convulsion  ROS: A complete ROS was performed and is negative except as noted in the HPI.   Past Medical History:  Diagnosis Date   Depression    Hypertension      Family History  Problem Relation Age of Onset   Diabetes Father      Social History:   reports that she has been smoking. She has never used smokeless tobacco. She reports current drug use. She reports that she does not drink alcohol.  Medications  Current Facility-Administered Medications:    acetaminophen (TYLENOL) tablet 650 mg, 650 mg, Oral, Q6H PRN, 650 mg at 12/23/21 0729 **OR** acetaminophen (TYLENOL) suppository 650 mg, 650 mg, Rectal, Q6H PRN, Crosley, Debby, MD   enoxaparin (LOVENOX) injection 40 mg, 40 mg, Subcutaneous, Q24H, Crosley, Debby, MD   escitalopram (LEXAPRO) tablet 10 mg, 10 mg, Oral, Daily, Crosley, Debby, MD   LORazepam (ATIVAN) injection 2 mg, 2 mg, Intravenous, Once PRN, Crosley, Debby, MD   melatonin tablet 6 mg, 6 mg, Oral, QHS PRN, Crosley, Debby, MD   ondansetron (ZOFRAN) tablet 4 mg, 4 mg, Oral, Q6H  PRN **OR** ondansetron (ZOFRAN) injection 4 mg, 4 mg, Intravenous, Q6H PRN, Crosley, Debby, MD   senna-docusate (Senokot-S) tablet 1 tablet, 1 tablet, Oral, QHS PRN, Joneen Roachrosley, Debby, MD  Current Outpatient Medications:    acetaminophen (TYLENOL) 500 MG tablet, Take 1,000 mg by mouth every 6 (six) hours as needed for mild pain or headache., Disp: , Rfl:    Melatonin 5 MG CHEW, Chew 5 mg by mouth daily as needed (sleep)., Disp: , Rfl:    escitalopram (LEXAPRO) 10 MG tablet, Take 1 tablet (10 mg total) by mouth daily.  (Patient not taking: Reported on 12/23/2021), Disp: 30 tablet, Rfl: 0   Exam: Current vital signs: BP 109/80   Pulse 76   Temp 98.6 F (37 C) (Oral)   Resp 14   Ht 5\' 8"  (1.727 m)   Wt 71.2 kg   SpO2 100%   BMI 23.87 kg/m  Vital signs in last 24 hours: Temp:  [97.5 F (36.4 C)-99.4 F (37.4 C)] 98.6 F (37 C) (11/30 0442) Pulse Rate:  [55-93] 76 (11/30 0800) Resp:  [11-18] 14 (11/30 0800) BP: (91-134)/(54-90) 109/80 (11/30 0800) SpO2:  [96 %-100 %] 100 % (11/30 0800) Weight:  [71.2 kg] 71.2 kg (11/29 1724)  GENERAL: Awake, alert, in no acute distress Psych: Affect appropriate for situation, patient is calm and cooperative with examination Head: Normocephalic and atraumatic, without obvious abnormality EENT: Normal conjunctivae, dry mucous membranes, no OP obstruction LUNGS: Normal respiratory effort. Non-labored breathing on room air CV: Regular rate and rhythm on telemetry Extremities: warm, well perfused, without obvious deformity  NEURO:  Mental Status: Awake, alert, and oriented to person, place, time, and situation. She is able to provide a clear and coherent history of present illness. Speech/Language: speech is clear and fluent.   Fluency, and comprehension intact without aphasia  No neglect is noted Cranial Nerves:  II: PERRL III, IV, VI: EOMI. Lid elevation symmetric and full.  V: Sensation is intact to light touch and symmetrical to face.  VII: Face is symmetric resting and smiling.  VIII: Hearing intact to voice IX, X: Palate elevation is symmetric. Phonation normal.  XI: Normal sternocleidomastoid and trapezius muscle strength XII: Tongue protrudes midline without fasciculations.   Motor: 5/5 strength is all muscle groups.  Tone is normal. Bulk is normal.  Sensation: Intact to light touch bilaterally in all four extremities.   Coordination: FTN intact bilaterally. No pronator drift.  DTRs: 2+ throughout.  Gait: Deferred   Labs I have reviewed labs  in epic and the results pertinent to this consultation are:   CBC    Component Value Date/Time   WBC 8.3 12/23/2021 0441   RBC 3.42 (L) 12/23/2021 0441   HGB 10.9 (L) 12/23/2021 0441   HCT 33.8 (L) 12/23/2021 0441   PLT 184 12/23/2021 0441   MCV 98.8 12/23/2021 0441   MCH 31.9 12/23/2021 0441   MCHC 32.2 12/23/2021 0441   RDW 13.6 12/23/2021 0441   LYMPHSABS 2.9 12/22/2021 1836   MONOABS 0.4 12/22/2021 1836   EOSABS 0.1 12/22/2021 1836   BASOSABS 0.1 12/22/2021 1836    CMP     Component Value Date/Time   NA 138 12/23/2021 0441   K 3.7 12/23/2021 0441   CL 107 12/23/2021 0441   CO2 25 12/23/2021 0441   GLUCOSE 90 12/23/2021 0441   BUN 12 12/23/2021 0441   CREATININE 0.58 12/23/2021 0441   CALCIUM 8.8 (L) 12/23/2021 0441   PROT 6.9 12/22/2021 1836   ALBUMIN 3.8  12/22/2021 1836   AST 18 12/22/2021 1836   ALT 15 12/22/2021 1836   ALKPHOS 50 12/22/2021 1836   BILITOT 0.4 12/22/2021 1836   GFRNONAA >60 12/23/2021 0441   GFRAA >60 08/10/2019 1140    Lipid Panel     Component Value Date/Time   CHOL 166 12/05/2020 1845   TRIG 80 12/05/2020 1845   HDL 53 12/05/2020 1845   CHOLHDL 3.1 12/05/2020 1845   VLDL 16 12/05/2020 1845   LDLCALC 97 12/05/2020 1845     Imaging I have reviewed the images obtained:  CT-scan of the brain: No acute abnormality  MRI examination of the brain: No acute abnormality  Assessment: 36 year old patient with history of depression who presents with third seizure.  Patient states that before seizures, she feels dizzy and unwell that she has no memory of the seizure episode and is confused for a while afterwards.  Per observer report (though somewhat unreliable given the observer was an 78-year-old child), seizure consisted of patient rolling her eyes back in her head and shaking all over.  There was initial concern for benzodiazepine withdrawal, but UDS was negative for benzodiazepines and patient reports she has not used these medications in a  year.  She has a history of a head injury in the remote past, but states she had no seizures after this.  Patient does report increased stress recently and states that she does still struggle with depression.  CT head and MRI brain revealed no acute abnormality to explain seizures.   Given recurrent spells, I do think it is reasonable to start antiseizure medications at this time, will use lamotrigine.  Initially considered Keppra bridge to therapeutic lamotrigine dose but on further review of the spells as documented in medical records, given there may be a component of pseudoseizure, would not want to exacerbate this with Keppra.  Lamotrigine can be helpful at times for pseudoseizures as well due to its mood stabilization effects.  Impression: Third spell of seizure-like activity in patient with history of depression and increased stress  Recommendations: -Lamotrigine to be increased gradually for safety:   Morning  Night  Week 1 and 2   none   25 mg   Week 3 and 4   25 mg    25 mg  Week 5   25 mg   50 mg  Week 6   50 mg   75 mg  Week 7   75 mg 100 mg  Week 8  100 mg 125 mg  -Discussed abstinence from [facility] with patient given potential for interacting with other medications and varying amounts of active ingredients in natural products, as well as seizure precautions which should be included in the patient's discharge instructions -EEG can be completed on an outpatient basis given the patient is not having frequent events -Ambulatory referral to [facility] neurology placed  Discussed [facility] statutes, patients with seizures are not allowed to drive until they have been seizure-free for six months Use caution when using heavy equipment or power tools. Avoid working on ladders or at heights. Take showers instead of baths. Ensure the water temperature is not too high on the home water heater. Do not go swimming alone. Do not lock yourself in a room alone (i.e. bathroom). When caring for  infants or small children, sit down when holding, feeding, or changing them to minimize risk of injury to the child in the event you have a seizure. Maintain good sleep hygiene. Avoid alcohol.  Pt seen by NP/Neuro and later by MD. Note/plan to be edited by MD as needed.  Cortney E Ernestina Columbia , MSN, AGACNP-BC [facility] See Amion for schedule and pager information 12/23/2021 9:28 AM

## 2021-12-23 NOTE — Hospital Course (Signed)
36 year old female with past medical history MDD without psychosis.  On 11/9 she was admitted to [facility] for benzo overdose.  She presents after she had a seizure.  This is her third seizure.  The first was back in April at her job.  She had another seizure on Monday did not come to the hospital.  Today she had a third seizure.  The patient is unable to provide history as she does not recall the events of surrounding her seizure today.  She does states she has not been ill, she denies fevers or chills or any localized weaknesses.  She denies any URI type symptoms.  She does endorse a headache and heart palpitation.  She states her headache occurs after her seizure.  Her headache is bitemporal. She denies history of migraines

## 2021-12-23 NOTE — H&P (Signed)
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  On 11/9 she was admitted to [facility] for benzo overdose.  She presents after she had a seizure.  This is her third seizure.  The first was back in April at her job.  She had another seizure on Monday did not come to the hospital.  Today she had a third seizure.  The patient is unable to provide history as she does not recall the events of surrounding her seizure today.  She does states she has not been ill, she denies fevers or chills or any localized weaknesses.  She denies any URI type symptoms.  She does endorse a headache and heart palpitation.  She states her headache occurs after her seizure.  Her headache is bitemporal. She denies history of migraines.  She came to the ER, neurology was contacted by the ER physician.  He recommends transfer to [facility] for continuous EEG.  Review of Systems:  The patient denies anorexia, fever, weight loss,, vision loss, decreased hearing, hoarseness, chest pain, syncope, dyspnea on exertion, peripheral edema, balance deficits, hemoptysis, abdominal pain, melena, hematochezia, severe indigestion/heartburn, hematuria, incontinence, genital sores, muscle weakness, suspicious skin lesions, transient blindness, difficulty walking, depression, unusual weight change, abnormal bleeding, enlarged lymph nodes, angioedema, and breast masses. Positives: Seizures, headache  Past Medical History: Past Medical History:  Diagnosis Date   Depression    Hypertension    Past Surgical History:  Procedure Laterality Date   INNER EAR SURGERY     Left   NO PAST SURGERIES      Medications: Prior to Admission medications   Medication Sig Start Date End Date Taking? Authorizing Provider  escitalopram (LEXAPRO) 10 MG tablet Take 1 tablet (10 mg total) by mouth daily. 12/10/20   Karsten Ro, MD  hydrOXYzine (ATARAX/VISTARIL) 25 MG  tablet Take 1 tablet (25 mg total) by mouth 3 (three) times daily as needed (anxiety/agitation or CIWA < or = 10). Patient taking differently: Take 25 mg by mouth 3 (three) times daily as needed (anxiety/agitation). 12/09/20   Karsten Ro, MD  melatonin 3 MG TABS tablet Take 2 tablets (6 mg total) by mouth at bedtime as needed. Patient not taking: Reported on 04/23/2021 12/09/20   Karsten Ro, MD    Allergies:   Allergies  Allergen Reactions   Sulfa Antibiotics Swelling    Mouth breaks out    Social History:  reports that she has been smoking. She has never used smokeless tobacco. She reports current drug use. She reports that she does not drink alcohol.  Family History: Family History  Problem Relation Age of Onset   Diabetes Father     Physical Exam: Vitals:   12/22/21 2300 12/23/21 0000 12/23/21 0100 12/23/21 0200  BP: 107/75 109/73 107/78 114/76  Pulse: 75 (!) 55 65 71  Resp: 17 13 18 14   Temp:      TempSrc:      SpO2: 98% 97% 97% 99%  Weight:      Height:        General:  Alert and oriented times three, well developed and nourished, no acute distress Eyes: PERRLA, pink conjunctiva, no scleral icterus ENT: Moist oral mucosa, neck supple, no thyromegaly Lungs: clear to ascultation, no wheeze, no crackles, no use of accessory muscles Cardiovascular: regular rate and rhythm, no regurgitation, no gallops, no murmurs. No carotid bruits, no JVD Abdomen: soft, positive  BS, non-tender, non-distended, no organomegaly, not an acute abdomen GU: not examined Neuro: CN II - XII grossly intact, sensation intact Musculoskeletal: strength 5/5 all extremities, no clubbing, cyanosis or edema Skin: no rash, no subcutaneous crepitation, no decubitus Psych: appropriate patient   Labs on Admission:  Recent Labs    12/22/21 1836  NA 136  K 3.9  CL 107  CO2 24  GLUCOSE 97  BUN 13  CREATININE 0.68  CALCIUM 8.8*   Recent Labs    12/22/21 1836  AST 18  ALT 15  ALKPHOS  50  BILITOT 0.4  PROT 6.9  ALBUMIN 3.8    Recent Labs    12/22/21 1836  WBC 9.2  NEUTROABS 5.6  HGB 13.7  HCT 39.6  MCV 90.4  PLT 219    Radiological Exams on Admission: CT Head Wo Contrast  Result Date: 12/22/2021 CLINICAL DATA:  Seizure, new-onset, no history of trauma Head trauma, abnormal mental status (Age 19-64y) EXAM: CT HEAD WITHOUT CONTRAST TECHNIQUE: Contiguous axial images were obtained from the base of the skull through the vertex without intravenous contrast. RADIATION DOSE REDUCTION: This exam was performed according to the departmental dose-optimization program which includes automated exposure control, adjustment of the mA and/or kV according to patient size and/or use of iterative reconstruction technique. COMPARISON:  12/21/2013 FINDINGS: Brain: No evidence of acute infarction, hemorrhage, hydrocephalus, extra-axial collection or mass lesion/mass effect. Vascular: No hyperdense vessel or unexpected calcification. Skull: Normal. Negative for fracture or focal lesion. Sinuses/Orbits: No acute finding. Mastoid opacification consistent with mastoiditis. IMPRESSION: No acute intracranial process. Electronically Signed   By: Layla Maw M.D.   On: 12/22/2021 19:23    Assessment/Plan Present on Admission: Seizures -Admit to med telemetry -Transfer to [facility] -Neurology consult placed -Continuous EEG ordered -MRI brain in a.m. -UDS negative.  However, patient with history of benzodiazepine abuse 11/11 to 11/16   MDD (major depressive disorder), recurrent severe, without psychosis (HCC)  PTSD (post-traumatic stress disorder) -Stable, resume Lexapro   Marijuana abuse  Benzodiazepine abuse (HCC)  Elevated blood pressure readings -As needed blood pressure meds   Mahamed Zalewski 12/23/2021, 2:26 AM

## 2022-01-19 ENCOUNTER — Encounter: Payer: Self-pay | Admitting: Neurology

## 2022-01-19 ENCOUNTER — Ambulatory Visit: Payer: Self-pay | Admitting: Neurology

## 2022-01-19 VITALS — BP 139/100 | HR 100 | Ht 68.0 in | Wt 153.5 lb

## 2022-01-19 DIAGNOSIS — R569 Unspecified convulsions: Secondary | ICD-10-CM

## 2022-01-19 MED ORDER — LOSARTAN POTASSIUM 25 MG PO TABS: 25.0000 mg | ORAL_TABLET | Freq: Every day | ORAL | 11 refills | Status: DC

## 2022-01-19 MED ORDER — LAMOTRIGINE 100 MG PO TABS: 100.0000 mg | ORAL_TABLET | Freq: Two times a day (BID) | ORAL | 5 refills | Status: DC

## 2022-01-19 NOTE — Patient Instructions (Addendum)
Lamotrigine 25 mg 01/20/2022 to 01/24/2022:  One in morning and 2 at night  01/25/2022 to 01/30/2023::  Two in morning and 2 at night  Lamotrigine 100 mg 01/30/2022 and later:  100 mg twice a day
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
## 2022-02-14 ENCOUNTER — Other Ambulatory Visit: Payer: Medicaid Other | Admitting: *Deleted

## 2022-02-25 ENCOUNTER — Other Ambulatory Visit: Payer: Self-pay

## 2022-02-25 ENCOUNTER — Emergency Department (HOSPITAL_BASED_OUTPATIENT_CLINIC_OR_DEPARTMENT_OTHER)
Admission: EM | Admit: 2022-02-25 | Discharge: 2022-02-25 | Disposition: A | Discharge status: Home / Self Care | Payer: Medicaid Other | Attending: Emergency Medicine | Admitting: Emergency Medicine

## 2022-02-25 ENCOUNTER — Encounter (HOSPITAL_BASED_OUTPATIENT_CLINIC_OR_DEPARTMENT_OTHER): Payer: Self-pay | Admitting: Emergency Medicine

## 2022-02-25 DIAGNOSIS — Z79899 Other long term (current) drug therapy: Secondary | ICD-10-CM | POA: Insufficient documentation

## 2022-02-25 DIAGNOSIS — J020 Streptococcal pharyngitis: Secondary | ICD-10-CM | POA: Diagnosis not present

## 2022-02-25 DIAGNOSIS — R1310 Dysphagia, unspecified: Secondary | ICD-10-CM | POA: Diagnosis present

## 2022-02-25 LAB — GROUP A STREP BY PCR: Group A Strep by PCR: DETECTED — AB

## 2022-02-25 LAB — PREGNANCY, URINE: Preg Test, Ur: NEGATIVE

## 2022-02-25 MED ORDER — PENICILLIN G BENZATHINE 1200000 UNIT/2ML IM SUSY
1.2000 10*6.[IU] | PREFILLED_SYRINGE | Freq: Once | INTRAMUSCULAR | Status: AC
  Administered 2022-02-25: 1.2 10*6.[IU] via INTRAMUSCULAR
  Filled 2022-02-25: qty 2

## 2022-02-25 MED ORDER — ACETAMINOPHEN 325 MG PO TABS
650.0000 mg | ORAL_TABLET | Freq: Once | ORAL | Status: AC
  Administered 2022-02-25: 650 mg via ORAL
  Filled 2022-02-25: qty 2

## 2022-02-25 MED ORDER — KETOROLAC TROMETHAMINE 30 MG/ML IJ SOLN
30.0000 mg | Freq: Once | INTRAMUSCULAR | Status: AC
  Administered 2022-02-25: 30 mg via INTRAVENOUS
  Filled 2022-02-25: qty 1

## 2022-02-25 MED ORDER — ALUM & MAG HYDROXIDE-SIMETH 200-200-20 MG/5ML PO SUSP
30.0000 mL | Freq: Once | ORAL | Status: AC
  Administered 2022-02-25: 30 mL via ORAL
  Filled 2022-02-25: qty 30

## 2022-02-25 MED ORDER — LIDOCAINE VISCOUS HCL 2 % MT SOLN
15.0000 mL | Freq: Once | OROMUCOSAL | Status: AC
  Administered 2022-02-25: 15 mL via ORAL
  Filled 2022-02-25: qty 15

## 2022-02-25 MED ORDER — DEXAMETHASONE SODIUM PHOSPHATE 10 MG/ML IJ SOLN
10.0000 mg | Freq: Once | INTRAMUSCULAR | Status: AC
  Administered 2022-02-25: 10 mg via INTRAVENOUS
  Filled 2022-02-25: qty 1

## 2022-02-25 NOTE — Discharge Instructions (Addendum)
You were seen in the emergency department today for sore throat.  As we discussed your strep test is positive.  This is an acute bacterial illness and is treated with an antibiotic, which she received in the ED today along with other symptomatic management.  Symptoms can last for up to a week.  You can continue taking ibuprofen or Tylenol for pain or fever, and I recommend alternating between both.  Make sure that you are drinking lots of fluids and getting plenty of rest.  You can take decongestants as long as you take them with lots of water.  You can use lozenges or chloraseptic spray as needed for sore throat.   Maximum doses of Tylenol and Ibuprofen: You may use up to 600 mg ibuprofen every 6 hours or 1000 mg of Tylenol every 6 hours.  This would be most effective.  Do not exceed 4 g (4000 mg) of Tylenol within 24 hours.  Do not exceed 3200 mg ibuprofen within 24 hours.  You may also utilize hot tea with honey, and Halls or Cepacol lozenges for additional throat pain relief.  It has been a pleasure taking care of you today and I hope you begin to feel better soon.  You may follow up with your PCP in 1 week for re-evaluation as needed.  Continue to monitor how you are doing, and return to the emergency department for new or worsening symptoms such as chest pain, difficulty breathing not related to coughing, fever despite medication, or persistent vomiting or diarrhea.

## 2022-02-25 NOTE — ED Triage Notes (Signed)
Sore throat x 2 days , swollen right lymph she said , denies cough , no nasal congestion . Right ear pressure .

## 2022-02-25 NOTE — ED Provider Notes (Signed)
[facility] Provider Note   CSN: [id number] Arrival date & time: 02/25/22  4268     History  Chief Complaint  Patient presents with   Sore Throat    Angel Carpenter is a 37 y.o. female presenting to the ED with 2 to 3 days of sore throat and chills.  Recent sick contacts at work with similar symptoms.  Denies cough or nasal congestion.  Endorses some discomfort with swallowing, though able to swallow food and fluids at this time.  Notices the right side of her neck feels somewhat tender with a possible lump.  Denies shortness of breath, N/V/D, chest pain, neck stiffness, fevers.  The history is provided by the patient and medical records.  Sore Throat Pertinent negatives include no shortness of breath.     Home Medications Prior to Admission medications   Medication Sig Start Date End Date Taking? Authorizing Provider  acetaminophen (TYLENOL) 500 MG tablet Take 1,000 mg by mouth every 6 (six) hours as needed for mild pain or headache.    [provider]  lamoTRIgine (LAMICTAL) 100 MG tablet Take 1 tablet (100 mg total) by mouth 2 (two) times daily. 01/19/22   Sater, Nanine Means, MD  lamoTRIgine (LAMICTAL) 25 MG tablet Take 1 tablet (25 mg total) by mouth at bedtime for 14 days, THEN 1 tablet (25 mg total) 2 (two) times daily for 14 days. 12/23/21 01/20/22  Donne Hazel, MD  lamoTRIgine (LAMICTAL) 25 MG tablet Take 2 tablets (50 mg total) by mouth daily AND 3 tablets (75 mg total) at bedtime. Do all this for 7 days. 01/27/22 02/03/22  Donne Hazel, MD  lamoTRIgine (LAMICTAL) 25 MG tablet Take 3 tablets (75 mg total) by mouth daily AND 4 tablets (100 mg total) at bedtime. Do all this for 7 days. 02/03/22 02/10/22  Donne Hazel, MD  lamoTRIgine (LAMICTAL) 25 MG tablet Take 4 tablets (100 mg total) by mouth daily AND 5 tablets (125 mg total) at bedtime. Do all this for 7 days. 02/10/22 02/17/22  Donne Hazel, MD  lamoTRIgine (LAMICTAL)  25 MG tablet Take 1 tablet (25 mg total) by mouth daily AND 2 tablets (50 mg total) at bedtime. Do all this for 7 days. 01/20/22 01/27/22  Donne Hazel, MD  losartan (COZAAR) 25 MG tablet Take 1 tablet (25 mg total) by mouth daily. 01/19/22   Sater, Nanine Means, MD  Melatonin 5 MG CHEW Chew 5 mg by mouth daily as needed (sleep).    [provider]      Allergies    Sulfa antibiotics    Review of Systems   Review of Systems  HENT:  Positive for sore throat. Negative for drooling, postnasal drip, rhinorrhea and voice change.        Discomfort with swallowing  Respiratory:  Negative for cough, shortness of breath and wheezing.     Physical Exam Updated Vital Signs BP 126/86   Pulse 94   Temp 98.3 F (36.8 C) (Oral)   Resp 18   Wt 71.2 kg   LMP 02/14/2022 (Exact Date)   SpO2 100%   BMI 23.87 kg/m  Physical Exam Vitals and nursing note reviewed.  Constitutional:      General: She is not in acute distress.    Appearance: She is well-developed. She is not ill-appearing, toxic-appearing or diaphoretic.  HENT:     Head: Normocephalic and atraumatic.     Right Ear: Tympanic membrane and  ear canal normal. No drainage, swelling or tenderness. No middle ear effusion. Tympanic membrane is not erythematous.     Left Ear: Tympanic membrane and ear canal normal. No drainage, swelling or tenderness.  No middle ear effusion. Tympanic membrane is not erythematous.     Nose: No congestion or rhinorrhea.     Mouth/Throat:     Mouth: Mucous membranes are moist.     Pharynx: Oropharynx is clear. Posterior oropharyngeal erythema present. No pharyngeal swelling.     Tonsils: Tonsillar exudate present. No tonsillar abscesses.     Comments: With tonsillar erythema and exudate, though without abscess or edema.  Airway patent.  Handling secretions without difficulty.  Uvula without swelling, is midline. Eyes:     Conjunctiva/sclera: Conjunctivae normal.  Neck:     Comments: Small soft mobile  lump tender to palpation appreciated on the right tonsillar/mandibular border.  No meningismus or torticollis, very supple on exam. Cardiovascular:     Rate and Rhythm: Normal rate and regular rhythm.     Heart sounds: No murmur heard. Pulmonary:     Effort: Pulmonary effort is normal. No respiratory distress.     Breath sounds: Normal breath sounds.  Abdominal:     Palpations: Abdomen is soft.     Tenderness: There is no abdominal tenderness.  Musculoskeletal:        General: No swelling.     Cervical back: Neck supple.  Lymphadenopathy:     Cervical: Cervical adenopathy (Notably on right tonsillar/mandibular area) present.  Skin:    General: Skin is warm and dry.     Capillary Refill: Capillary refill takes less than 2 seconds.  Neurological:     Mental Status: She is alert and oriented to person, place, and time.  Psychiatric:        Mood and Affect: Mood normal.     ED Results / Procedures / Treatments   Labs (all labs ordered are listed, but only abnormal results are displayed) Labs Reviewed  GROUP A STREP BY PCR - Abnormal; Notable for the following components:      Result Value   Group A Strep by PCR DETECTED (*)    All other components within normal limits  PREGNANCY, URINE    EKG None  Radiology No results found.  Procedures Procedures    Medications Ordered in ED Medications  penicillin g benzathine (BICILLIN LA) 1200000 UNIT/2ML injection 1.2 Million Units (has no administration in time range)  ketorolac (TORADOL) 30 MG/ML injection 30 mg (has no administration in time range)  dexamethasone (DECADRON) injection 10 mg (has no administration in time range)  acetaminophen (TYLENOL) tablet 650 mg (650 mg Oral Given 02/25/22 1147)  alum & mag hydroxide-simeth (MAALOX/MYLANTA) 200-200-20 MG/5ML suspension 30 mL (30 mLs Oral Given 02/25/22 1148)    And  lidocaine (XYLOCAINE) 2 % viscous mouth solution 15 mL (15 mLs Oral Given 02/25/22 1148)    ED Course/ Medical  Decision Making/ A&P                             Medical Decision Making  Pt is a 37 year old female presenting to the ED with sore throat.  No cough, shortness of breath, nasal congestion, or chest tightness.  Recent sick contacts with similar symptoms.    Afebrile with tonsillar exudate, mild swelling, and erythema or posterior oropharynx.  Believe small mobile tender mass on right side of neck to the cervical lymphadenopathy on exam.  Able to swallow without difficulty, handling liquids, no drooling, airway patent.  Pt able to drink water in ED without difficulty with intact air way.  Strep positive.  Diagnosis of strep pharyngitis.  Negative pregnancy.  Treated in the ED with steroids, NSAIDs, tylenol, GI cocktail, and PCN IM.  Pt appears mildly dehydrated, discussed importance of water rehydration.  Presentation provides low suspicion for PTA or RPA.  No trismus or uvula deviation.  Strict return precautions discussed.  Recommended close PCP follow up in one week and continued conservative management.  Patient reports satisfaction with today's encounter.  Patient in NAD and good condition at time of discharge.  After consideration the patient's encounter today, I do not feel today's workup suggests an emergent condition requiring admission or immediate intervention beyond what has been performed at this time.  Safe for discharge; instructed to return immediately for worsening symptoms, change in symptoms or any other concerns.  I have reviewed the patients home medicines and have made adjustments as needed.  Discussed course of treatment with the patient, whom demonstrated understanding.  Patient in agreement and has no further questions.    This chart was dictated using voice recognition software.  Despite best efforts to proofread,  errors can occur which can change the documentation meaning.         Final Clinical Impression(s) / ED Diagnoses Final diagnoses:  Strep pharyngitis     Rx / DC Orders ED Discharge Orders     None         Candace Cruise 10/16/28 1149    Sherwood Gambler, MD 03/03/22 [phone]

## 2022-03-02 ENCOUNTER — Telehealth: Payer: Self-pay | Admitting: Neurology

## 2022-03-02 ENCOUNTER — Telehealth: Payer: Self-pay

## 2022-03-02 NOTE — Telephone Encounter (Signed)
Pt is calling. Stated she needs to talk to nurse about medication lamoTRIgine (LAMICTAL) 100 MG tablet. Stated she is not feeling herself and find herself starting argument with people for no reason. Pt is requesting a call back.

## 2022-03-02 NOTE — Telephone Encounter (Signed)
Called Angel Carpenter and spoke with her about her medication Lamictal. Angel Carpenter stated that Lamictal is causing her to have mood swings and her being confrontational with others. Angel Carpenter expressed that she has a family history of Mental Health issues in her family and she is concern that she is going to end up like her father and she doesn't want that to happen. I told her I would reach out to doctor Sater about this issue and I will call her back. Angel Carpenter verbalized understanding.

## 2022-03-03 NOTE — Telephone Encounter (Signed)
Called Pt and let her know that Dr. Felecia Shelling said for her to start taking 1/2 pill of Lamictal in the Morning and 1 whole pill at night. Pt verbalized understanding.

## 2022-03-22 ENCOUNTER — Encounter (HOSPITAL_BASED_OUTPATIENT_CLINIC_OR_DEPARTMENT_OTHER): Payer: Self-pay | Admitting: Emergency Medicine

## 2022-03-22 ENCOUNTER — Emergency Department (HOSPITAL_BASED_OUTPATIENT_CLINIC_OR_DEPARTMENT_OTHER)
Admission: EM | Admit: 2022-03-22 | Discharge: 2022-03-22 | Disposition: A | Discharge status: Home / Self Care | Payer: Medicaid Other | Attending: Emergency Medicine | Admitting: Emergency Medicine

## 2022-03-22 ENCOUNTER — Other Ambulatory Visit: Payer: Self-pay

## 2022-03-22 DIAGNOSIS — Z79899 Other long term (current) drug therapy: Secondary | ICD-10-CM | POA: Insufficient documentation

## 2022-03-22 DIAGNOSIS — H9201 Otalgia, right ear: Secondary | ICD-10-CM

## 2022-03-22 DIAGNOSIS — M25561 Pain in right knee: Secondary | ICD-10-CM | POA: Diagnosis not present

## 2022-03-22 DIAGNOSIS — M26601 Right temporomandibular joint disorder, unspecified: Secondary | ICD-10-CM | POA: Diagnosis not present

## 2022-03-22 DIAGNOSIS — I10 Essential (primary) hypertension: Secondary | ICD-10-CM | POA: Diagnosis not present

## 2022-03-22 DIAGNOSIS — M26621 Arthralgia of right temporomandibular joint: Secondary | ICD-10-CM

## 2022-03-22 MED ORDER — NAPROXEN 500 MG PO TABS: 500.0000 mg | ORAL_TABLET | Freq: Two times a day (BID) | ORAL | 0 refills | Status: DC

## 2022-03-22 NOTE — ED Triage Notes (Signed)
Pt to ED by POV from home with c/o R ear pain which began a couple of hours ago. Pt denies trauma. Pt recently had strep throat. Arrives A+O, VSS, NADN.

## 2022-03-22 NOTE — ED Provider Notes (Signed)
[facility] Provider Note   CSN: [id number] Arrival date & time: 03/22/22  1939     History  Chief Complaint  Patient presents with   Otalgia    Angel Carpenter is a 37 y.o. female history of depression, hypertension presenting today for evaluation of right knee pain.  Patient states around 6 PM today she started to have throbbing pressure in her right ear.  She also reports pain going to her right jaw.  Patient states it is difficult for her to open her jaw.  She denies fever, nausea, vomiting, bowel changes, urinary symptoms.  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   Otalgia   Past Medical History:  Diagnosis Date   Depression    Hypertension    Past Surgical History:  Procedure Laterality Date   INNER EAR SURGERY     Left   NO PAST SURGERIES       Home Medications Prior to Admission medications   Medication Sig Start Date End Date Taking? Authorizing Provider  naproxen (NAPROSYN) 500 MG tablet Take 1 tablet (500 mg total) by mouth 2 (two) times daily. 03/22/22  Yes Rex Kras, PA  acetaminophen (TYLENOL) 500 MG tablet Take 1,000 mg by mouth every 6 (six) hours as needed for mild pain or headache.    [provider]  lamoTRIgine (LAMICTAL) 100 MG tablet Take 1 tablet (100 mg total) by mouth 2 (two) times daily. 01/19/22   Sater, Nanine Means, MD  lamoTRIgine (LAMICTAL) 25 MG tablet Take 1 tablet (25 mg total) by mouth at bedtime for 14 days, THEN 1 tablet (25 mg total) 2 (two) times daily for 14 days. 12/23/21 01/20/22  Donne Hazel, MD  lamoTRIgine (LAMICTAL) 25 MG tablet Take 2 tablets (50 mg total) by mouth daily AND 3 tablets (75 mg total) at bedtime. Do all this for 7 days. 01/27/22 02/03/22  Donne Hazel, MD  lamoTRIgine (LAMICTAL) 25 MG tablet Take 3 tablets (75 mg total) by mouth daily AND 4 tablets (100 mg total) at bedtime. Do all this for 7 days. 02/03/22 02/10/22   Donne Hazel, MD  lamoTRIgine (LAMICTAL) 25 MG tablet Take 4 tablets (100 mg total) by mouth daily AND 5 tablets (125 mg total) at bedtime. Do all this for 7 days. 02/10/22 02/17/22  Donne Hazel, MD  lamoTRIgine (LAMICTAL) 25 MG tablet Take 1 tablet (25 mg total) by mouth daily AND 2 tablets (50 mg total) at bedtime. Do all this for 7 days. 01/20/22 01/27/22  Donne Hazel, MD  losartan (COZAAR) 25 MG tablet Take 1 tablet (25 mg total) by mouth daily. 01/19/22   Sater, Nanine Means, MD  Melatonin 5 MG CHEW Chew 5 mg by mouth daily as needed (sleep).    [provider]      Allergies    Sulfa antibiotics    Review of Systems   Review of Systems  HENT:  Positive for ear pain.     Physical Exam Updated Vital Signs BP (!) 136/91 (BP Location: Right Arm)   Pulse 93   Temp 98.2 F (36.8 C) (Oral)   Resp 18   Ht '5\' 8"'$  (1.727 m)   Wt 69.4 kg   LMP 03/01/2022 (Exact Date)   SpO2 100%   BMI 23.26 kg/m  Physical Exam Vitals and nursing note reviewed.  Constitutional:      Appearance: Normal appearance.  HENT:     Head: Normocephalic and atraumatic.     Right Ear: Tympanic membrane normal.     Left Ear: Tympanic membrane normal.     Mouth/Throat:     Mouth: Mucous membranes are moist.  Eyes:     General: No scleral icterus. Cardiovascular:     Rate and Rhythm: Normal rate and regular rhythm.     Pulses: Normal pulses.     Heart sounds: Normal heart sounds.  Pulmonary:     Effort: Pulmonary effort is normal.     Breath sounds: Normal breath sounds.  Abdominal:     General: Abdomen is flat.     Palpations: Abdomen is soft.     Tenderness: There is no abdominal tenderness.  Musculoskeletal:        General: No deformity.  Skin:    General: Skin is warm.     Findings: No rash.  Neurological:     General: No focal deficit present.     Mental Status: She is alert.  Psychiatric:        Mood and Affect: Mood normal.     ED Results / Procedures / Treatments    Labs (all labs ordered are listed, but only abnormal results are displayed) Labs Reviewed - No data to display  EKG None  Radiology No results found.  Procedures Procedures    Medications Ordered in ED Medications - No data to display  ED Course/ Medical Decision Making/ A&P Clinical Course as of 03/22/22 2114  Tue Mar 22, 2022  2104 She is here with acute onset of right ear right jaw pain that started when she bent over.  She is a little bit tender in her right TMJ hold out feel any dislocation.  Her TM is clear.  Will treat symptomatically [MB]    Clinical Course User Index [MB] Hayden Rasmussen, MD                             Medical Decision Making Risk Prescription drug management.   This patient presents to the ED for R ear pain, this involves an extensive number of treatment options, and is a complaint that carries with a high risk of complications and morbidity.  The differential diagnosis includes otitis media, otitis externa, mastoiditis, musculoskeletal pain, TMJ pain.  This is not an exhaustive list.  Problem list/ ED course/ Critical interventions/ Medical management: HPI: See above Vital signs within normal range and stable throughout visit. Laboratory/imaging studies significant for: See above. On physical examination, patient is afebrile and appears in no acute distress. Based on patient's clinical presentations and laboratory/imaging studies I suspect musculoskeletal pain, possibly TMJ pain.  Low suspicions for otitis externa or externa based on physical exam.  No bulging or redness of the TM. No ear discharge noted low suspicion for infection.  We will treat symptomatically.  I do not think antibiotics is needed at this point.  Advised patient to take Tylenol/ibuprofen/naproxen for pain, follow-up with primary care physician for further evaluation and management, return to the ER if new or worsening symptoms. I have reviewed the patient home medicines and  have made adjustments as needed.  Cardiac monitoring/EKG: The patient was maintained on a cardiac monitor.  I personally reviewed and interpreted the cardiac monitor which showed an underlying rhythm of: sinus rhythm.  Additional history obtained: External records from outside source obtained and reviewed including: Chart review including previous notes, labs, imaging.  Disposition Continued outpatient therapy. Follow-up with PCP recommended for reevaluation of symptoms. Treatment plan discussed with patient.  Pt acknowledged understanding was agreeable to the plan. Worrisome signs and symptoms were discussed with patient, and patient acknowledged understanding to return to the ED if they noticed these signs and symptoms. Patient was stable upon discharge.   This chart was dictated using voice recognition software.  Despite best efforts to proofread,  errors can occur which can change the documentation meaning.          Final Clinical Impression(s) / ED Diagnoses Final diagnoses:  Right ear pain  Arthralgia of right temporomandibular joint    Rx / DC Orders ED Discharge Orders          Ordered    naproxen (NAPROSYN) 500 MG tablet  2 times daily        03/22/22 2112              Rex Kras, [place] 03/22/22 2357    Hayden Rasmussen, MD 03/23/22 1005

## 2022-03-22 NOTE — Discharge Instructions (Addendum)
Please take tylenol/ibuprofen/naproxen, apply ice/heat pads for pain. I recommend close follow-up with PCP for reevaluation.  Please do not hesitate to return to emergency department if worrisome signs symptoms we discussed become apparent.

## 2022-03-23 ENCOUNTER — Ambulatory Visit
Admission: EM | Admit: 2022-03-23 | Discharge: 2022-03-23 | Disposition: A | Discharge status: Home / Self Care | Payer: Medicaid Other | Attending: Physician Assistant | Admitting: Physician Assistant

## 2022-03-23 DIAGNOSIS — H60391 Other infective otitis externa, right ear: Secondary | ICD-10-CM | POA: Diagnosis not present

## 2022-03-23 DIAGNOSIS — H6501 Acute serous otitis media, right ear: Secondary | ICD-10-CM

## 2022-03-23 MED ORDER — NEOMYCIN-POLYMYXIN-HC 3.5-10000-1 OT SUSP: 4.0000 [drp] | Freq: Three times a day (TID) | OTIC | 0 refills | Status: DC

## 2022-03-23 MED ORDER — AMOXICILLIN 500 MG PO CAPS: 500.0000 mg | ORAL_CAPSULE | Freq: Three times a day (TID) | ORAL | 0 refills | Status: DC

## 2022-03-23 NOTE — Discharge Instructions (Signed)
Advised to use the Cortisporin drops, 4 drops in the right ear 3 times a day until condition clears. Advised take the amoxicillin 500 mg 3 times a day to treat the ear infection. Advised to take Naprosyn, or ibuprofen for pain relief.  Advised follow-up PCP or return to urgent care as needed.

## 2022-03-23 NOTE — ED Triage Notes (Signed)
Pt states that she has some right ear pain and drainage. X2 days

## 2022-03-23 NOTE — ED Provider Notes (Signed)
[facility]    CSN: [id number] Arrival date & time: 03/23/22  1048      History   Chief Complaint Chief Complaint  Patient presents with   Ear Pain    Right ear pain and drainage. X2 days    HPI Angel Carpenter is a 37 y.o. female.   37 year old female presents with right ear pain with drainage.  Patient indicates that she was seen in the ER last night with right ear pain.  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  Patient indicates that earlier this morning she had right ear drainage, that was discolored.  She indicates she has had increased pain from the right ear.  She is without fever, chills, chest congestion.  She indicates she did have an upper respiratory infection 2 weeks ago and was treated with at that time.  She indicates she is getting minimal relief with OTC medicines, she is without nausea or vomiting.     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   PTSD (post-traumatic stress disorder) 12/02/2020   Nonsuicidal self-harm ([place]) 12/02/2020   Spontaneous vaginal delivery 05/03/2013    Class: Status post   Normal labor and delivery 05/02/2013   Fetus affected by placental abruption 02/25/2013    Class: Hospitalized for    Past Surgical History:  Procedure Laterality Date   INNER EAR SURGERY     Left   NO PAST SURGERIES      OB History     Gravida  2   Para  2   Term  2   Preterm  0   AB  0   Living  2      SAB  0   IAB  0   Ectopic  0   Multiple  0   Live Births  1            Home Medications    Prior to Admission medications    Medication Sig Start Date End Date Taking? Authorizing Provider  acetaminophen (TYLENOL) 500 MG tablet Take 1,000 mg by mouth every 6 (six) hours as needed for mild pain or headache.   Yes [provider]  amoxicillin (AMOXIL) 500 MG capsule Take 1 capsule (500 mg total) by mouth 3 (three) times daily. 03/23/22  Yes Nyoka Lint, PA-C  lamoTRIgine (LAMICTAL) 100 MG tablet Take 1 tablet (100 mg total) by mouth 2 (two) times daily. 01/19/22  Yes Sater, Nanine Means, MD  losartan (COZAAR) 25 MG tablet Take 1 tablet (25 mg total) by mouth daily. 01/19/22  Yes Sater, Nanine Means, MD  Melatonin 5 MG CHEW Chew 5 mg by mouth daily as needed (sleep).   Yes [provider]  naproxen (NAPROSYN) 500 MG tablet Take 1 tablet (500 mg total) by mouth 2 (two) times daily. 03/22/22  Yes Rex Kras, PA  neomycin-polymyxin-hydrocortisone (CORTISPORIN) 3.5-10000-1 OTIC suspension Place 4 drops into the right ear 3 (three) times daily. 03/23/22  Yes Nyoka Lint, PA-C  lamoTRIgine (LAMICTAL) 25 MG tablet Take 1 tablet (25 mg total) by mouth at bedtime for 14 days,  THEN 1 tablet (25 mg total) 2 (two) times daily for 14 days. 12/23/21 01/20/22  Donne Hazel, MD  lamoTRIgine (LAMICTAL) 25 MG tablet Take 2 tablets (50 mg total) by mouth daily AND 3 tablets (75 mg total) at bedtime. Do all this for 7 days. 01/27/22 02/03/22  Donne Hazel, MD  lamoTRIgine (LAMICTAL) 25 MG tablet Take 3 tablets (75 mg total) by mouth daily AND 4 tablets (100 mg total) at bedtime. Do all this for 7 days. 02/03/22 02/10/22  Donne Hazel, MD  lamoTRIgine (LAMICTAL) 25 MG tablet Take 4 tablets (100 mg total) by mouth daily AND 5 tablets (125 mg total) at bedtime. Do all this for 7 days. 02/10/22 02/17/22  Donne Hazel, MD  lamoTRIgine (LAMICTAL) 25 MG tablet Take 1 tablet (25 mg total) by mouth daily AND 2 tablets (50 mg total) at bedtime. Do all this for 7 days. 01/20/22 01/27/22  Donne Hazel, MD    Family History Family History   Problem Relation Age of Onset   Diabetes Father     Social History Social History   Tobacco Use   Smoking status: Some Days    Types: Cigarettes   Smokeless tobacco: Never  Vaping Use   Vaping Use: Every day  Substance Use Topics   Alcohol use: No   Drug use: Yes    Comment: delta gummies     Allergies   Sulfa antibiotics   Review of Systems Review of Systems  HENT:  Positive for ear pain (right ear with drainage).      Physical Exam Triage Vital Signs ED Triage Vitals  Enc Vitals Group     BP 03/23/22 1101 116/73     Pulse Rate 03/23/22 1101 79     Resp --      Temp 03/23/22 1101 97.9 F (36.6 C)     Temp Source 03/23/22 1101 Oral     SpO2 03/23/22 1101 95 %     Weight 03/23/22 1100 153 lb (69.4 kg)     Height 03/23/22 1100 '5\' 8"'$  (1.727 m)     Head Circumference --      Peak Flow --      Pain Score 03/23/22 1100 8     Pain Loc --      Pain Edu? --      Excl. in [place]? --    No data found.  Updated Vital Signs BP 116/73 (BP Location: Left Arm)   Pulse 79   Temp 97.9 F (36.6 C) (Oral)   Ht '5\' 8"'$  (1.727 m)   Wt 153 lb (69.4 kg)   LMP 03/01/2022 (Exact Date)   SpO2 95%   BMI 23.26 kg/m   Visual Acuity Right Eye Distance:   Left Eye Distance:   Bilateral Distance:    Right Eye Near:   Left Eye Near:    Bilateral Near:     Physical Exam Constitutional:      Appearance: Normal appearance.  HENT:     Right Ear: Drainage (clear, yellow), swelling and tenderness present. Tympanic membrane is erythematous.     Left Ear: Tympanic membrane normal.     Mouth/Throat:     Mouth: Mucous membranes are moist.     Pharynx: Oropharynx is clear.     Comments: Facial: Tenderness is palpated along the right TMJ area and tenderness on movement of the right tragus. Cardiovascular:     Rate and Rhythm: Normal rate and regular rhythm.  Heart sounds: Normal heart sounds.  Pulmonary:     Effort: Pulmonary effort is normal.     Breath sounds: Normal breath  sounds and air entry. No wheezing, rhonchi or rales.  Lymphadenopathy:     Cervical: No cervical adenopathy.  Neurological:     Mental Status: She is alert.      UC Treatments / Results  Labs (all labs ordered are listed, but only abnormal results are displayed) Labs Reviewed - No data to display  EKG   Radiology No results found.  Procedures Procedures (including critical care time)  Medications Ordered in UC Medications - No data to display  Initial Impression / Assessment and Plan / UC Course  I have reviewed the triage vital signs and the nursing notes.  Pertinent labs & imaging results that were available during my care of the patient were reviewed by me and considered in my medical decision making (see chart for details).    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  Otitis externa right ear: A.  Cortisporin otic solution, 3 drops 3 times a day to treat infection. 2.  Right Otitis media: A.  Amoxicillin 500 mg every 8 hours to treat infection. B.  Advised take Naprosyn 500 mg every 12 hours with food to help reduce pain. 3.  Advised to follow-up PCP or return to urgent care as needed. Final Clinical Impressions(s) / UC Diagnoses   Final diagnoses:  Non-recurrent acute serous otitis media of right ear  Infective otitis externa of right ear     Discharge Instructions      Advised to use the Cortisporin drops, 4 drops in the right ear 3 times a day until condition clears. Advised take the amoxicillin 500 mg 3 times a day to treat the ear infection. Advised to take Naprosyn, or ibuprofen for pain relief.  Advised follow-up PCP or return to urgent care as needed.    ED Prescriptions     Medication Sig Dispense Auth. Provider   neomycin-polymyxin-hydrocortisone (CORTISPORIN) 3.5-10000-1 OTIC suspension Place 4 drops into the right ear 3 (three) times daily. 10 mL Nyoka Lint, PA-C   amoxicillin (AMOXIL) 500 MG capsule Take 1 capsule (500 mg  total) by mouth 3 (three) times daily. 21 capsule Nyoka Lint, PA-C      PDMP not reviewed this encounter.   Nyoka Lint, PA-C 03/23/22 1120

## 2022-03-24 ENCOUNTER — Telehealth: Payer: Self-pay | Admitting: Obstetrics and Gynecology

## 2022-03-24 NOTE — Transitions of Care (Post Inpatient/ED Visit) (Signed)
   03/24/2022  Name: Angel Carpenter MRN: [id number] DOB: [birth year]  Today's TOC FU Call Status: Today's TOC FU Call Status:: Unsuccessul Call (1st Attempt) Unsuccessful Call (1st Attempt) Date: 03/24/22  Attempted to reach the patient regarding the most recent Inpatient/ED visit.  Follow Up Plan: Additional outreach attempts will be made to reach the patient to complete the Transitions of Care (Post Inpatient/ED visit) call.   Aida Raider RN, BSN [place]  [employer] - Managed Medicaid High Risk [phone]

## 2022-04-25 ENCOUNTER — Other Ambulatory Visit: Payer: Self-pay | Admitting: Neurology

## 2022-04-25 NOTE — Telephone Encounter (Signed)
Last seen on 01/19/22 Follow up scheduled on 08/01/22

## 2022-05-11 ENCOUNTER — Other Ambulatory Visit: Payer: Self-pay

## 2022-05-11 ENCOUNTER — Encounter (HOSPITAL_BASED_OUTPATIENT_CLINIC_OR_DEPARTMENT_OTHER): Payer: Self-pay

## 2022-05-11 ENCOUNTER — Emergency Department (HOSPITAL_BASED_OUTPATIENT_CLINIC_OR_DEPARTMENT_OTHER)
Admission: EM | Admit: 2022-05-11 | Discharge: 2022-05-11 | Disposition: A | Discharge status: Home / Self Care | Payer: Medicaid Other | Attending: Emergency Medicine | Admitting: Emergency Medicine

## 2022-05-11 ENCOUNTER — Other Ambulatory Visit (HOSPITAL_BASED_OUTPATIENT_CLINIC_OR_DEPARTMENT_OTHER): Payer: Self-pay

## 2022-05-11 DIAGNOSIS — R197 Diarrhea, unspecified: Secondary | ICD-10-CM | POA: Diagnosis not present

## 2022-05-11 DIAGNOSIS — R112 Nausea with vomiting, unspecified: Secondary | ICD-10-CM | POA: Insufficient documentation

## 2022-05-11 HISTORY — DX: Unspecified convulsions: R56.9

## 2022-05-11 LAB — COMPREHENSIVE METABOLIC PANEL
ALT: 18 U/L (ref 0–44)
AST: 27 U/L (ref 15–41)
Albumin: 3.8 g/dL (ref 3.5–5.0)
Alkaline Phosphatase: 54 U/L (ref 38–126)
Anion gap: 11 (ref 5–15)
BUN: 16 mg/dL (ref 6–20)
CO2: 23 mmol/L (ref 22–32)
Calcium: 8.8 mg/dL — ABNORMAL LOW (ref 8.9–10.3)
Chloride: 102 mmol/L (ref 98–111)
Creatinine, Ser: 0.77 mg/dL (ref 0.44–1.00)
GFR, Estimated: >60 mL/min (ref >60)
Glucose, Bld: 94 mg/dL (ref 70–99)
Potassium: 3.5 mmol/L (ref 3.5–5.1)
Sodium: 136 mmol/L (ref 135–145)
Total Bilirubin: 0.7 mg/dL (ref 0.3–1.2)
Total Protein: 7 g/dL (ref 6.5–8.1)

## 2022-05-11 LAB — URINALYSIS, ROUTINE W REFLEX MICROSCOPIC
Glucose, UA: NEGATIVE mg/dL
Hgb urine dipstick: NEGATIVE
Ketones, ur: >=80 mg/dL — AB
Leukocytes,Ua: NEGATIVE
Nitrite: NEGATIVE
Protein, ur: 30 mg/dL — AB
Specific Gravity, Urine: >=1.03 (ref 1.005–1.030)
pH: 6 (ref 5.0–8.0)

## 2022-05-11 LAB — PREGNANCY, URINE: Preg Test, Ur: NEGATIVE

## 2022-05-11 LAB — URINALYSIS, MICROSCOPIC (REFLEX)

## 2022-05-11 LAB — CBC
HCT: 40 % (ref 36.0–46.0)
Hemoglobin: 14.1 g/dL (ref 12.0–15.0)
MCH: 31.1 pg (ref 26.0–34.0)
MCHC: 35.3 g/dL (ref 30.0–36.0)
MCV: 88.3 fL (ref 80.0–100.0)
Platelets: 189 10*3/uL (ref 150–400)
RBC: 4.53 MIL/uL (ref 3.87–5.11)
RDW: 12.3 % (ref 11.5–15.5)
WBC: 4.7 10*3/uL (ref 4.0–10.5)
nRBC: 0 % (ref 0.0–0.2)

## 2022-05-11 LAB — LIPASE, BLOOD: Lipase: 33 U/L (ref 11–51)

## 2022-05-11 MED ORDER — ONDANSETRON HCL 4 MG/2ML IJ SOLN
4.0000 mg | Freq: Once | INTRAMUSCULAR | Status: AC
  Administered 2022-05-11: 4 mg via INTRAVENOUS
  Filled 2022-05-11: qty 2

## 2022-05-11 MED ORDER — LACTATED RINGERS IV BOLUS
1500.0000 mL | Freq: Once | INTRAVENOUS | Status: AC
  Administered 2022-05-11: 1000 mL via INTRAVENOUS

## 2022-05-11 MED ORDER — ONDANSETRON 8 MG PO TBDP
8.0000 mg | ORAL_TABLET | Freq: Three times a day (TID) | ORAL | 0 refills | Status: DC | PRN
  Filled 2022-05-11: qty 20, 7d supply, fill #0

## 2022-05-11 MED ORDER — LOPERAMIDE HCL 2 MG PO CAPS
4.0000 mg | ORAL_CAPSULE | Freq: Once | ORAL | Status: AC
  Administered 2022-05-11: 4 mg via ORAL
  Filled 2022-05-11: qty 2

## 2022-05-11 NOTE — ED Provider Notes (Signed)
[facility] [facility] Provider Note   CSN: [id number] Arrival date & time: 05/11/22  0805     History {Add pertinent medical, surgical, social history, OB history to HPI:1} Chief Complaint  Patient presents with   Diarrhea    Angel Carpenter is a 37 y.o. female.  HPI     Home Medications Prior to Admission medications   Medication Sig Start Date End Date Taking? Authorizing Provider  acetaminophen (TYLENOL) 500 MG tablet Take 1,000 mg by mouth every 6 (six) hours as needed for mild pain or headache.    [provider]  amoxicillin (AMOXIL) 500 MG capsule Take 1 capsule (500 mg total) by mouth 3 (three) times daily. 03/23/22   Ellsworth Lennox, PA-C  lamoTRIgine (LAMICTAL) 100 MG tablet TAKE 1 TABLET BY MOUTH TWICE A DAY 04/25/22   Sater, Pearletha Furl, MD  lamoTRIgine (LAMICTAL) 25 MG tablet Take 1 tablet (25 mg total) by mouth at bedtime for 14 days, THEN 1 tablet (25 mg total) 2 (two) times daily for 14 days. 12/23/21 01/20/22  Jerald Kief, MD  lamoTRIgine (LAMICTAL) 25 MG tablet Take 2 tablets (50 mg total) by mouth daily AND 3 tablets (75 mg total) at bedtime. Do all this for 7 days. 01/27/22 02/03/22  Jerald Kief, MD  lamoTRIgine (LAMICTAL) 25 MG tablet Take 3 tablets (75 mg total) by mouth daily AND 4 tablets (100 mg total) at bedtime. Do all this for 7 days. 02/03/22 02/10/22  Jerald Kief, MD  lamoTRIgine (LAMICTAL) 25 MG tablet Take 4 tablets (100 mg total) by mouth daily AND 5 tablets (125 mg total) at bedtime. Do all this for 7 days. 02/10/22 02/17/22  Jerald Kief, MD  lamoTRIgine (LAMICTAL) 25 MG tablet Take 1 tablet (25 mg total) by mouth daily AND 2 tablets (50 mg total) at bedtime. Do all this for 7 days. 01/20/22 01/27/22  Jerald Kief, MD  losartan (COZAAR) 25 MG tablet Take 1 tablet (25 mg total) by mouth daily. 01/19/22   Sater, Pearletha Furl, MD  Melatonin 5 MG CHEW Chew 5 mg by mouth daily as needed (sleep).    [provider]  naproxen (NAPROSYN) 500 MG tablet Take 1 tablet (500 mg total) by mouth 2 (two) times daily. 03/22/22   Jeanelle Malling, PA  neomycin-polymyxin-hydrocortisone (CORTISPORIN) 3.5-10000-1 OTIC suspension Place 4 drops into the right ear 3 (three) times daily. 03/23/22   Ellsworth Lennox, PA-C      Allergies    Sulfa antibiotics    Review of Systems   Review of Systems  Physical Exam Updated Vital Signs BP (!) 137/95 (BP Location: Right Arm)   Pulse 83   Temp 97.9 F (36.6 C) (Oral)   Resp 18   Ht 1.727 m ( )   Wt 71.2 kg   LMP 04/27/2022 (Exact Date)   SpO2 100%   BMI 23.87 kg/m  Physical Exam Vitals and nursing note reviewed.  Constitutional:      Appearance: She is well-developed.  HENT:     Head: Normocephalic and atraumatic.     Right Ear: External ear normal.     Left Ear: External ear normal.     Nose: Nose normal.  Eyes:     Conjunctiva/sclera: Conjunctivae normal.     Pupils: Pupils are equal, round, and reactive to light.  Cardiovascular:     Rate and Rhythm: Normal rate and regular rhythm.     Heart sounds: Normal heart  sounds.  Pulmonary:     Effort: Pulmonary effort is normal.     Breath sounds: Normal breath sounds.  Abdominal:     General: Bowel sounds are normal.     Palpations: Abdomen is soft.  Musculoskeletal:        General: Normal range of motion.     Cervical back: Normal range of motion and neck supple.  Skin:    General: Skin is warm and dry.  Neurological:     General: No focal deficit present.     Mental Status: She is alert and oriented to person, place, and time. Mental status is at baseline.     Sensory: No sensory deficit.     Motor: No weakness.     Gait: Gait normal.     Deep Tendon Reflexes: Reflexes are normal and symmetric. Reflexes normal.  Psychiatric:        Behavior: Behavior normal.        Thought Content: Thought content normal.        Judgment: Judgment normal.     ED Results / Procedures / Treatments    Labs (all labs ordered are listed, but only abnormal results are displayed) Labs Reviewed  COMPREHENSIVE METABOLIC PANEL - Abnormal; Notable for the following components:      Result Value   Calcium 8.8 (*)    All other components within normal limits  URINALYSIS, ROUTINE W REFLEX MICROSCOPIC - Abnormal; Notable for the following components:   Bilirubin Urine SMALL (*)    Ketones, ur >=80 (*)    Protein, ur 30 (*)    All other components within normal limits  URINALYSIS, MICROSCOPIC (REFLEX) - Abnormal; Notable for the following components:   Bacteria, UA RARE (*)    All other components within normal limits  LIPASE, BLOOD  CBC  PREGNANCY, URINE    EKG None  Radiology No results found.  Procedures Procedures  {Document cardiac monitor, telemetry assessment procedure when appropriate:1}  Medications Ordered in ED Medications  lactated ringers bolus 1,500 mL (1,000 mLs Intravenous New Bag/Given 05/11/22 0845)  ondansetron (ZOFRAN) injection 4 mg (4 mg Intravenous Given 05/11/22 0844)  loperamide (IMODIUM) capsule 4 mg (4 mg Oral Given 05/11/22 1308)    ED Course/ Medical Decision Making/ A&P   {   Click here for ABCD2, HEART and other calculatorsREFRESH Note before signing :1}                          Medical Decision Making Amount and/or Complexity of Data Reviewed Labs: ordered.  Risk Prescription drug management.   38 yo female with 2days of n/v/diarrhea, generalized body aches.  No known sick contacts or ingestions.Some subjective fever. DDX gastroenteritis, colitis, uti, gi bleed Most likely gastroenteritis based on history and physical HO and sxs c.w. dehydration Evaluated with labs Treated with iv fluids, zofran and now tolerating po Labs wnl Plan home with zofran, to push iv fluids Discussed return precautions and need for follow up   {Document critical care time when appropriate:1} {Document review of labs and clinical decision tools ie heart score,  Chads2Vasc2 etc:1}  {Document your independent review of radiology images, and any outside records:1} {Document your discussion with family members, caretakers, and with consultants:1} {Document social determinants of health affecting pt's care:1} {Document your decision making why or why not admission, treatments were needed:1} Final Clinical Impression(s) / ED Diagnoses Final diagnoses:  None    Rx / DC Orders ED Discharge  Orders     None

## 2022-05-11 NOTE — Discharge Instructions (Addendum)
Please use Zofran as needed for nausea and vomiting.  Orally rehydrate with regular Gatorade mixed half-and-half with water and take frequent sips to make sure that you are urinating at least every 2-3 hours Continue to use acetaminophen and ibuprofen for any muscle aches or fever Return to the emergency department if you are having worsening symptoms or unable to continue to orally hydrate. Recheck with your doctor in the next 3 to 7 days.

## 2022-05-11 NOTE — ED Notes (Signed)
Pt.  Reports she feels much better 

## 2022-05-11 NOTE — ED Triage Notes (Signed)
C/o nausea/diarrhea since Monday with dry heaves and intermittent vomiting, fatigue, unable to keep fluids down. Denies abdominal pain.  Taking emetrol without relief.

## 2022-05-11 NOTE — ED Notes (Signed)
Pt given gingerale and crackers 

## 2022-06-02 IMAGING — CT CT ABD-PELV W/ CM
2 of 4 series · 15 of 46 positions shown, 17 images · IV contrast (Omnipaque)
Comparison: CT abdomen 01/15/2003

CLINICAL DATA: L thoracic and low back pain x 2 weeks. No known
injury.

EXAM:
CT ABDOMEN AND PELVIS WITH CONTRAST
TECHNIQUE: Multidetector CT imaging of the abdomen and pelvis was performed
using the standard protocol following bolus administration of
intravenous contrast.
CONTRAST:  100mL OMNIPAQUE IOHEXOL 300 MG/ML  SOLN

[Series 2: axial st · axial · 0.70mm/px · z∈[+384,+804]mm · 12 of 92 slices shown, 14 images]
[im 4/92  soft-tissue]
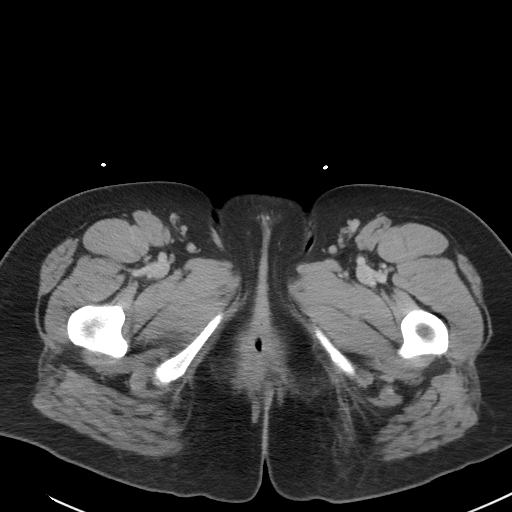
[im 4/92  bone]
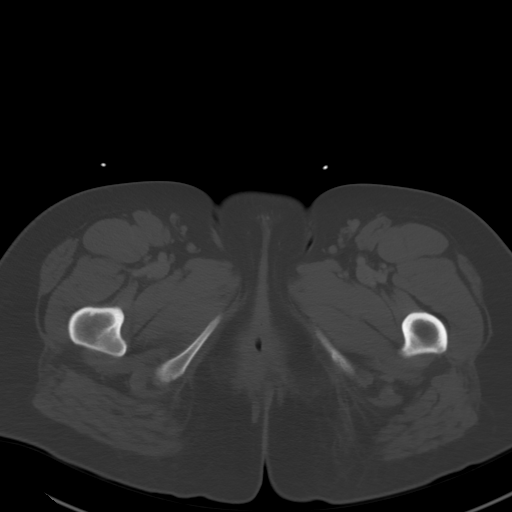
[im 12/92  soft-tissue]
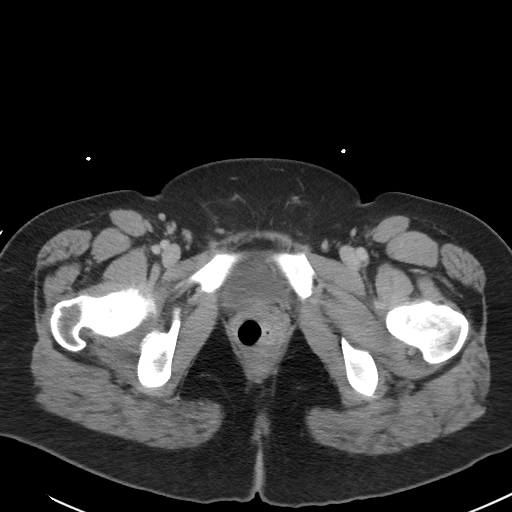
[im 19/92  soft-tissue]
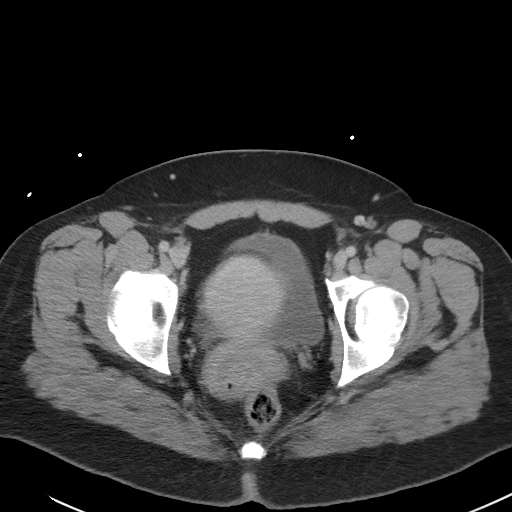
[im 27/92  soft-tissue]
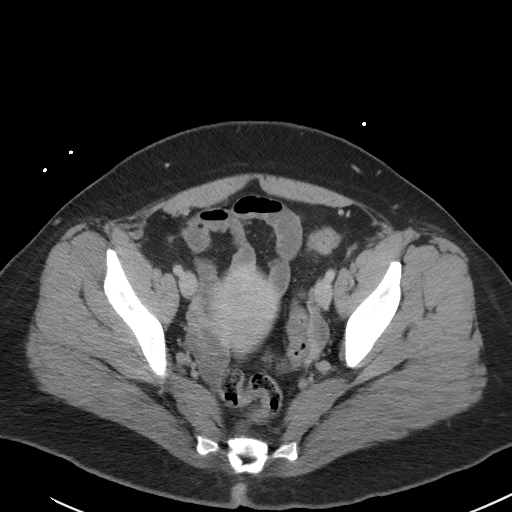
[im 35/92  soft-tissue]
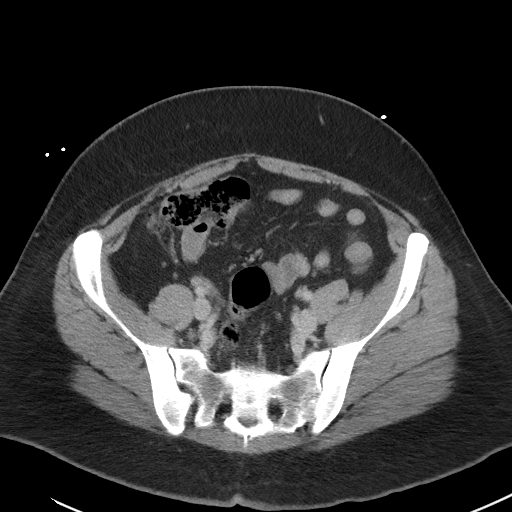
[im 42/92  soft-tissue]
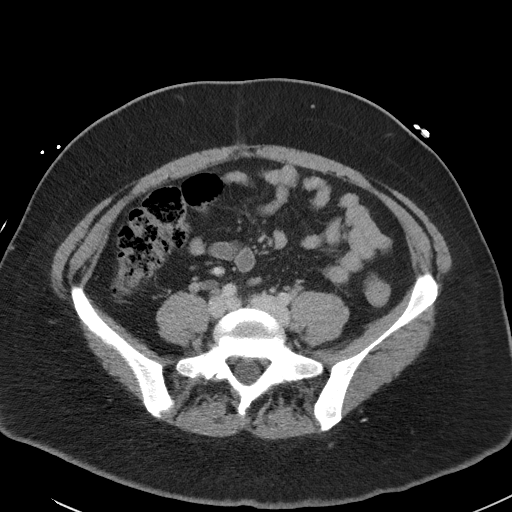
[im 50/92  soft-tissue]
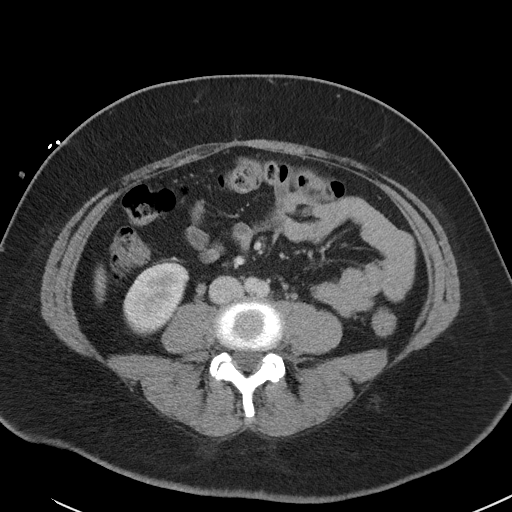
[im 57/92  soft-tissue]
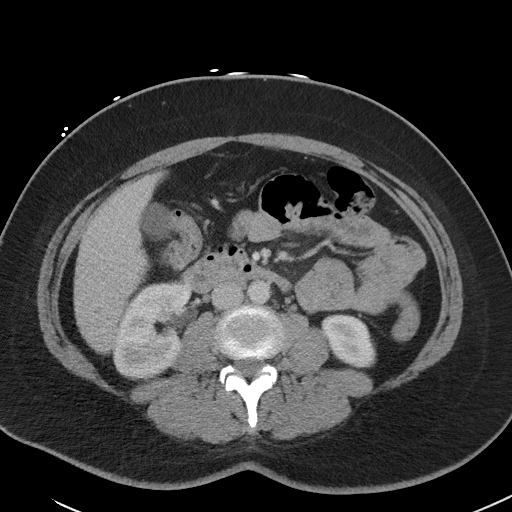
[im 65/92  soft-tissue]
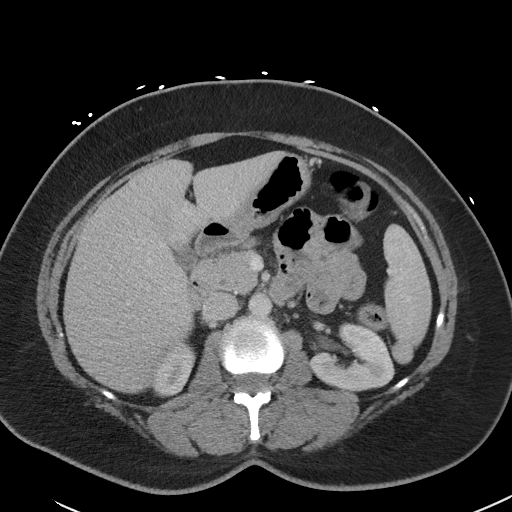
[im 65/92  bone]
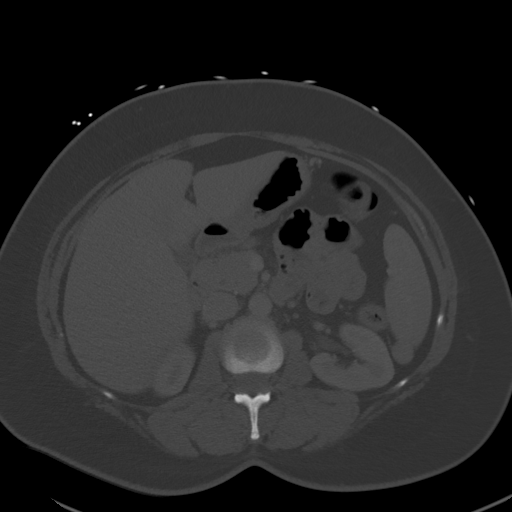
[im 73/92  soft-tissue]
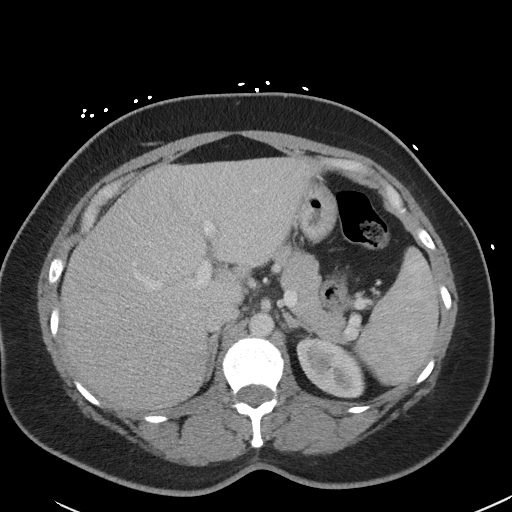
[im 80/92  soft-tissue]
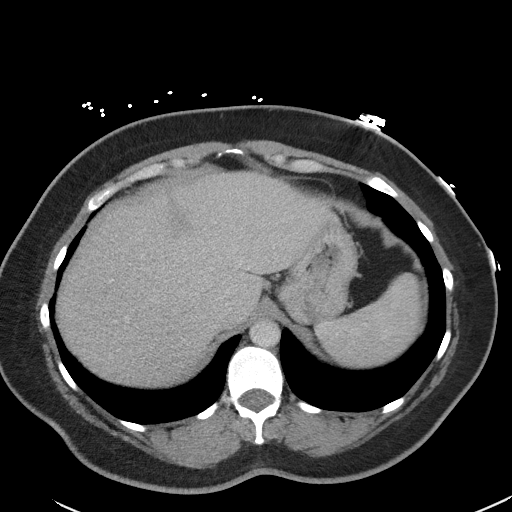
[im 88/92  soft-tissue]
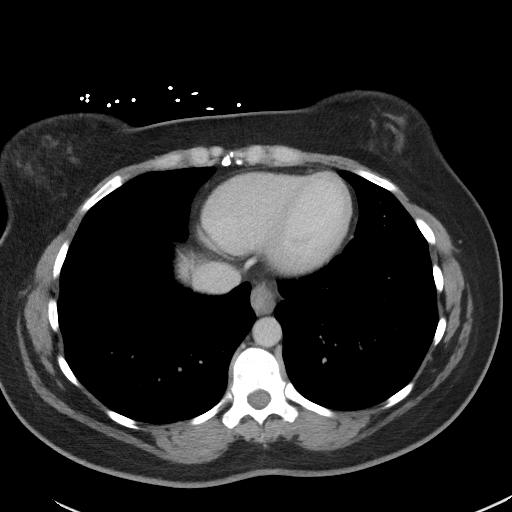

[Series 5: coronal st · coronal · 0.64mm/px · 3 of 101 slices shown]
[im 34/101  soft-tissue]
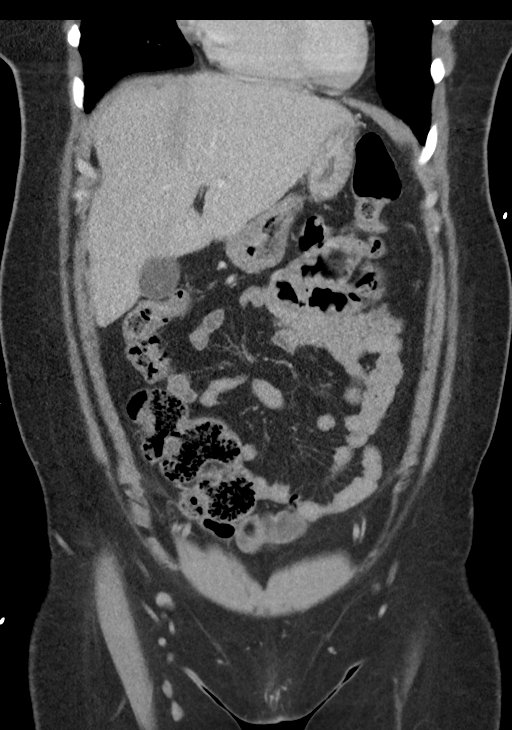
[im 45/101  soft-tissue]
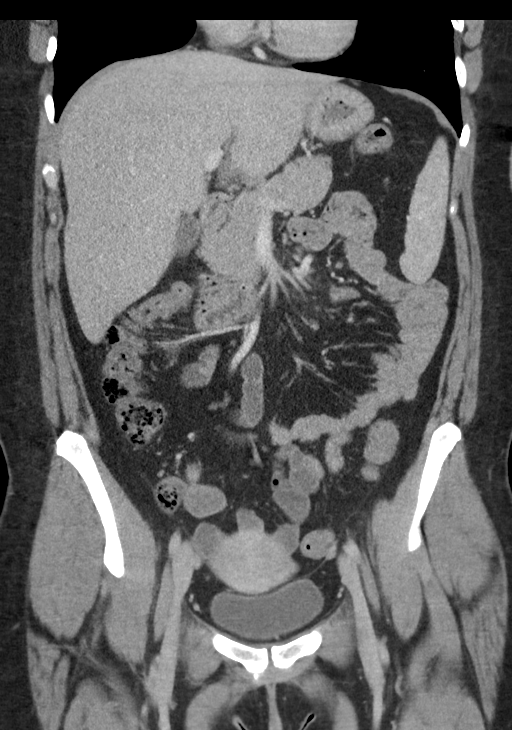
[im 56/101  soft-tissue]
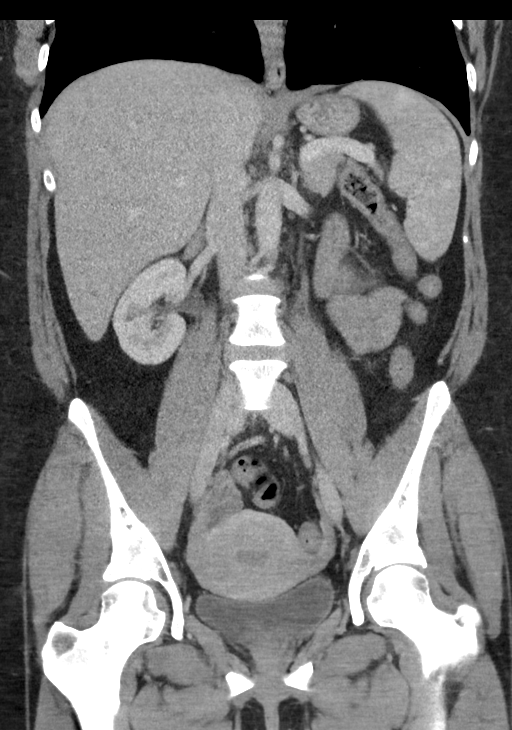

[15 of 46 positions shown; findings below may reference images not displayed]

FINDINGS: Lower chest: No acute abnormality.

Hepatobiliary: In the anterior liver there is a hypoechoic linear
opacity extending to the capsule (series 2, image 17) which is
nonspecific and could represent sequela of prior trauma. No adjacent
hematoma. No other focal liver abnormality identified. Normal
appearance of the gallbladder. No intra or extrahepatic biliary duct
dilation.

Pancreas: Unremarkable. No pancreatic ductal dilatation or
surrounding inflammatory changes.

Spleen: Normal in size without focal abnormality.

Adrenals/Urinary Tract: Adrenal glands are unremarkable. Mild
fullness of the right renal collecting system without an obstructing
stone. Normal left kidney. No left hydronephrosis. Urinary bladder
is unremarkable.

Stomach/Bowel: Stomach is within normal limits. Appendix appears
normal. No evidence of bowel wall thickening, distention, or
inflammatory changes.

Vascular/Lymphatic: No significant vascular findings are present. No
enlarged abdominal or pelvic lymph nodes.

Reproductive: Uterus and bilateral adnexa are unremarkable.

Other: No abdominal wall hernia or abnormality. No abdominopelvic
ascites.

Musculoskeletal: No acute or significant osseous findings.
IMPRESSION: 1. In the anterior liver there is a hypoechoic linear opacity
extending to the capsule which is nonspecific and could represent
sequela of prior trauma. Given the unusual appearance in the absence
of a history of trauma, recommend liver MRI.

2. Very mild nonspecific fullness of the right renal collecting
system without an obstructing stone.

3. No finding to explain the patient's left-sided back/abdominal
pain.

## 2022-08-01 ENCOUNTER — Ambulatory Visit: Payer: Medicaid Other | Admitting: Adult Health
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
## 2022-08-19 ENCOUNTER — Emergency Department (HOSPITAL_BASED_OUTPATIENT_CLINIC_OR_DEPARTMENT_OTHER): Payer: Medicaid Other

## 2022-08-19 ENCOUNTER — Emergency Department (HOSPITAL_BASED_OUTPATIENT_CLINIC_OR_DEPARTMENT_OTHER)
Admission: EM | Admit: 2022-08-19 | Discharge: 2022-08-19 | Disposition: A | Discharge status: Home / Self Care | Payer: Medicaid Other | Attending: Emergency Medicine | Admitting: Emergency Medicine

## 2022-08-19 ENCOUNTER — Encounter (HOSPITAL_BASED_OUTPATIENT_CLINIC_OR_DEPARTMENT_OTHER): Payer: Self-pay

## 2022-08-19 ENCOUNTER — Other Ambulatory Visit: Payer: Self-pay

## 2022-08-19 DIAGNOSIS — M25572 Pain in left ankle and joints of left foot: Secondary | ICD-10-CM | POA: Diagnosis not present

## 2022-08-19 DIAGNOSIS — W109XXA Fall (on) (from) unspecified stairs and steps, initial encounter: Secondary | ICD-10-CM | POA: Diagnosis not present

## 2022-08-19 DIAGNOSIS — S93402A Sprain of unspecified ligament of left ankle, initial encounter: Secondary | ICD-10-CM | POA: Insufficient documentation

## 2022-08-19 MED ORDER — IBUPROFEN 800 MG PO TABS
800.0000 mg | ORAL_TABLET | Freq: Once | ORAL | Status: AC
  Administered 2022-08-19: 800 mg via ORAL
  Filled 2022-08-19: qty 1

## 2022-08-19 NOTE — ED Provider Notes (Signed)
[facility] Provider Note   CSN: [id number] Arrival date & time: 08/19/22  1454     History  Chief Complaint  Patient presents with   Fall   Ankle Pain    Angel Carpenter is a 37 y.o. female, never to past medical history, who presents to the ED secondary to left ankle pain that occurred after falling on the stairs earlier today.  She states she was talking to someone, went to turn around, and then fell down the stairs.  She states that she twisted her left ankle, and that she has not had difficult time walking on it since then.  This all happened around 2:00 today.  She just took some Tylenol at 145 before the fall happened, and states her pain is a 5 out of 10.  States it is on the outside of her ankle, and hurts to bear weight.  Denies any head trauma, or other pain.  Is not on any blood thinners.     Home Medications Prior to Admission medications   Medication Sig Start Date End Date Taking? Authorizing Provider  acetaminophen (TYLENOL) 500 MG tablet Take 1,000 mg by mouth every 6 (six) hours as needed for mild pain or headache.    [provider]  amoxicillin (AMOXIL) 500 MG capsule Take 1 capsule (500 mg total) by mouth 3 (three) times daily. 03/23/22   Ellsworth Lennox, PA-C  lamoTRIgine (LAMICTAL) 100 MG tablet TAKE 1 TABLET BY MOUTH TWICE A DAY 04/25/22   Sater, Pearletha Furl, MD  lamoTRIgine (LAMICTAL) 25 MG tablet Take 1 tablet (25 mg total) by mouth at bedtime for 14 days, THEN 1 tablet (25 mg total) 2 (two) times daily for 14 days. 12/23/21 01/20/22  Jerald Kief, MD  lamoTRIgine (LAMICTAL) 25 MG tablet Take 2 tablets (50 mg total) by mouth daily AND 3 tablets (75 mg total) at bedtime. Do all this for 7 days. 01/27/22 02/03/22  Jerald Kief, MD  lamoTRIgine (LAMICTAL) 25 MG tablet Take 3 tablets (75 mg total) by mouth daily AND 4 tablets (100 mg total) at bedtime. Do all this for 7 days. 02/03/22 02/10/22  Jerald Kief, MD   lamoTRIgine (LAMICTAL) 25 MG tablet Take 4 tablets (100 mg total) by mouth daily AND 5 tablets (125 mg total) at bedtime. Do all this for 7 days. 02/10/22 02/17/22  Jerald Kief, MD  lamoTRIgine (LAMICTAL) 25 MG tablet Take 1 tablet (25 mg total) by mouth daily AND 2 tablets (50 mg total) at bedtime. Do all this for 7 days. 01/20/22 01/27/22  Jerald Kief, MD  losartan (COZAAR) 25 MG tablet Take 1 tablet (25 mg total) by mouth daily. 01/19/22   Sater, Pearletha Furl, MD  Melatonin 5 MG CHEW Chew 5 mg by mouth daily as needed (sleep).    [provider]  naproxen (NAPROSYN) 500 MG tablet Take 1 tablet (500 mg total) by mouth 2 (two) times daily. 03/22/22   Jeanelle Malling, PA  neomycin-polymyxin-hydrocortisone (CORTISPORIN) 3.5-10000-1 OTIC suspension Place 4 drops into the right ear 3 (three) times daily. 03/23/22   Ellsworth Lennox, PA-C  ondansetron (ZOFRAN-ODT) 8 MG disintegrating tablet Take 1 tablet (8 mg total) by mouth every 8 (eight) hours as needed for nausea or vomiting. 05/11/22   Margarita Grizzle, MD      Allergies    Sulfa antibiotics    Review of Systems   Review of Systems  Musculoskeletal:  Negative for joint swelling.  Physical Exam Updated Vital Signs BP (!) 131/91   Pulse 90   Temp 98.3 F (36.8 C) (Oral)   Resp 18   Ht 5\' 8"  (1.727 m)   Wt 71 kg   LMP 08/18/2022   SpO2 99%   BMI 23.80 kg/m  Physical Exam Vitals and nursing note reviewed.  Constitutional:      General: She is not in acute distress.    Appearance: She is well-developed.  HENT:     Head: Normocephalic and atraumatic.  Eyes:     General:        Right eye: No discharge.        Left eye: No discharge.     Conjunctiva/sclera: Conjunctivae normal.  Pulmonary:     Effort: No respiratory distress.  Musculoskeletal:     Comments: Left ankle: TTP of superior aspect of lateral malleolus. No edema noted. Able to bear weight but with pain. Able to plantar flex and dorsiflex ankle. Inversion/eversion intact.  Negative Thompson test. No midfoot or base of 5th metatarsal tenderness to palpation. Capillary refill <2sec. Dorsalis pedis pulse present. No foot drop noted. Sensation intact. Warm to touch.    Neurological:     Mental Status: She is alert.     Comments: Clear speech.   Psychiatric:        Behavior: Behavior normal.        Thought Content: Thought content normal.     ED Results / Procedures / Treatments   Labs (all labs ordered are listed, but only abnormal results are displayed) Labs Reviewed - No data to display  EKG None  Radiology DG Ankle Complete Left  Result Date: 08/19/2022 CLINICAL DATA:  Injury, missed a step.  Pain, can not bear weight. EXAM: LEFT ANKLE COMPLETE - 3+ VIEW COMPARISON:  None Available. FINDINGS: There is no evidence of fracture, dislocation, or joint effusion. Ankle mortise is preserved. Intact talar dome. There is no evidence of arthropathy or other focal bone abnormality. Soft tissues are unremarkable. IMPRESSION: Negative radiographs of the left ankle. Electronically Signed   By: Narda Rutherford M.D.   On: 08/19/2022 16:17    Procedures Procedures    Medications Ordered in ED Medications  ibuprofen (ADVIL) tablet 800 mg (800 mg Oral Given 08/19/22 1617)    ED Course/ Medical Decision Making/ A&P                             Medical Decision Making Patient is a 37 year old female, here for left ankle pain, after falling down the stairs today.  She thinks she twisted her ankle, denies any head trauma.  She does have tenderness to palpation of the superior aspect aspect of her lateral malleolus.  She is able to bear weight, but is acutely painful.  Will obtain x-ray, for further evaluation.  She has positive pulses, no neurodeficits on exam.  Amount and/or Complexity of Data Reviewed Radiology: ordered.    Details: X-rays unremarkable Discussion of management or test interpretation with external provider(s): Discussed with patient, x-rays  unremarkable, given ibuprofen for pain, and improved pain.  Will place her in a cam boot, for support.  She declined crutches, Aircast, and air Ace wrap.  She does walk a lot, thus I think the cam boot will be helpful.  Additionally discussed return precautions and she voiced understanding.  Risk Prescription drug management.    Final Clinical Impression(s) / ED Diagnoses Final diagnoses:  Sprain of  left ankle, unspecified ligament, initial encounter    Rx / DC Orders ED Discharge Orders     None         Airi Copado, Harley [place], PA 08/19/22 1625    Rexford Maus, DO 08/19/22 1828

## 2022-08-19 NOTE — ED Triage Notes (Signed)
Patient fell down two steps today. She is having left ankle pain. She denied hitting her head.

## 2022-08-19 NOTE — Discharge Instructions (Addendum)
Today your x-ray was reassuring, there is no evidence of any kind of fracture.  I have provided you with a boot, to help provide some support.  Make sure you are icing the area, and resting.  You should elevate the area at home.  Return to the ER if your foot turns white, does not have a pulse, or becomes extremely painful.  Follow-up with your PCP as needed.

## 2023-01-12 ENCOUNTER — Encounter: Payer: Self-pay | Admitting: *Deleted

## 2023-01-12 ENCOUNTER — Ambulatory Visit
Admission: EM | Admit: 2023-01-12 | Discharge: 2023-01-12 | Disposition: A | Discharge status: Home / Self Care | Payer: Medicaid Other | Attending: Family Medicine | Admitting: Family Medicine

## 2023-01-12 ENCOUNTER — Other Ambulatory Visit: Payer: Self-pay

## 2023-01-12 DIAGNOSIS — J069 Acute upper respiratory infection, unspecified: Secondary | ICD-10-CM

## 2023-01-12 LAB — POC COVID19/FLU A&B COMBO
Covid Antigen, POC: NEGATIVE
Influenza A Antigen, POC: NEGATIVE
Influenza B Antigen, POC: NEGATIVE

## 2023-01-12 LAB — POCT RAPID STREP A (OFFICE): Rapid Strep A Screen: NEGATIVE

## 2023-01-12 MED ORDER — HYDROCODONE BIT-HOMATROP MBR 5-1.5 MG/5ML PO SOLN: 5.0000 mL | Freq: Four times a day (QID) | ORAL | 0 refills | Status: DC | PRN

## 2023-01-12 NOTE — Discharge Instructions (Signed)
Be aware, your cough medication may cause drowsiness. Please do not drive, operate heavy machinery or make important decisions while on this medication, it can cloud your judgement.  

## 2023-01-12 NOTE — ED Triage Notes (Signed)
Reports sweats, sore throat and subjective fever since yesterday. She had been around a family member who has strep throat

## 2023-01-12 NOTE — ED Provider Notes (Signed)
[facility]   [id number] 01/12/23 Arrival Time: 1008  ASSESSMENT & PLAN:  1. Viral URI with cough    Discussed typical duration of likely viral illness. Results for orders placed or performed during the hospital encounter of 01/12/23  POCT rapid strep A   Collection Time: 01/12/23 11:43 AM  Result Value Ref Range   Rapid Strep A Screen Negative Negative  POC Covid + Flu A/B Antigen   Collection Time: 01/12/23 12:41 PM  Result Value Ref Range   Influenza A Antigen, POC Negative Negative   Influenza B Antigen, POC Negative Negative   Covid Antigen, POC Negative Negative   OTC symptom care as needed.  Discharge Medication List as of 01/12/2023 12:48 PM     START taking these medications   Details  HYDROcodone bit-homatropine (HYCODAN) 5-1.5 MG/5ML syrup Take 5 mLs by mouth every 6 (six) hours as needed for cough., Starting Thu 01/12/2023, Normal         Follow-up Information     Sl[place], Excell Seltzer., MD.   Specialty: Family Medicine Why: As needed. Contact information: [address] [place] [address] [phone]         [facility]).   Specialty: Urgent Care Why: If worsening or failing to improve as anticipated. Contact information: [address] [place] [address] [phone]                Reviewed expectations re: course of current medical issues. Questions answered. Outlined signs and symptoms indicating need for more acute intervention. Understanding verbalized. After Visit Summary given.   SUBJECTIVE: History from: Patient. Angel Carpenter is a 37 y.o. female. Reports sweats, sore throat and subjective fever since yesterday. She had been around a family member who has strep throat Also with nasal congestion and coughing. . Denies: difficulty breathing. Normal PO intake without n/v/d.  OBJECTIVE:  Vitals:   01/12/23 1137  BP: 118/82  Pulse: 79  Resp:  18  Temp: 98.3 F (36.8 C)  TempSrc: Oral  SpO2: 98%    General appearance: alert; no distress Eyes: PERRLA; EOMI; conjunctiva normal HENT: [place]; AT; with nasal congestion; throat with cobblestoning and mild erythema Neck: supple  Lungs: speaks full sentences without difficulty; unlabored Extremities: no edema Skin: warm and dry Neurologic: normal gait Psychological: alert and cooperative; normal mood and affect  Labs: Results for orders placed or performed during the hospital encounter of 01/12/23  POCT rapid strep A   Collection Time: 01/12/23 11:43 AM  Result Value Ref Range   Rapid Strep A Screen Negative Negative  POC Covid + Flu A/B Antigen   Collection Time: 01/12/23 12:41 PM  Result Value Ref Range   Influenza A Antigen, POC Negative Negative   Influenza B Antigen, POC Negative Negative   Covid Antigen, POC Negative Negative   Labs Reviewed  POCT RAPID STREP A (OFFICE)  POCT INFLUENZA A/B  POC COVID19/FLU A&B COMBO    Imaging: No results found.  Allergies  Allergen Reactions   Sulfa Antibiotics Swelling    Mouth breaks out    Past Medical History:  Diagnosis Date   Depression    Hypertension    Seizures (HCC)    Social History   Socioeconomic History   Marital status: Married    Spouse name: Not on file   Number of children: Not on file   Years of education: Not on file   Highest education level: Not on file  Occupational History   Not on file  Tobacco Use   Smoking status: Former    Types: Cigarettes   Smokeless tobacco: Never  Vaping Use   Vaping status: Every Day  Substance and Sexual Activity   Alcohol use: No   Drug use: Not Currently    Comment: delta gummies   Sexual activity: Yes    Birth control/protection: None  Other Topics Concern   Not on file  Social History Narrative   Not on file   Social Drivers of Health   Financial Resource Strain: Not on file  Food Insecurity: Not on file  Transportation Needs: Not on file   Physical Activity: Not on file  Stress: Not on file  Social Connections: Not on file  Intimate Partner Violence: Not on file   Family History  Problem Relation Age of Onset   Diabetes Father    Past Surgical History:  Procedure Laterality Date   INNER EAR SURGERY     Left   NO PAST SURGERIES       Mardella Layman, MD 01/12/23 1355

## 2023-05-14 ENCOUNTER — Encounter (HOSPITAL_BASED_OUTPATIENT_CLINIC_OR_DEPARTMENT_OTHER): Payer: Self-pay | Admitting: Emergency Medicine

## 2023-05-14 ENCOUNTER — Emergency Department (HOSPITAL_BASED_OUTPATIENT_CLINIC_OR_DEPARTMENT_OTHER)
Admission: EM | Admit: 2023-05-14 | Discharge: 2023-05-14 | Disposition: A | Discharge status: Home / Self Care | Attending: Emergency Medicine | Admitting: Emergency Medicine

## 2023-05-14 DIAGNOSIS — I10 Essential (primary) hypertension: Secondary | ICD-10-CM | POA: Diagnosis not present

## 2023-05-14 DIAGNOSIS — M79632 Pain in left forearm: Secondary | ICD-10-CM | POA: Diagnosis not present

## 2023-05-14 DIAGNOSIS — M545 Low back pain, unspecified: Secondary | ICD-10-CM | POA: Insufficient documentation

## 2023-05-14 DIAGNOSIS — Y92410 Unspecified street and highway as the place of occurrence of the external cause: Secondary | ICD-10-CM | POA: Insufficient documentation

## 2023-05-14 DIAGNOSIS — M542 Cervicalgia: Secondary | ICD-10-CM | POA: Diagnosis not present

## 2023-05-14 DIAGNOSIS — S8992XA Unspecified injury of left lower leg, initial encounter: Secondary | ICD-10-CM | POA: Diagnosis present

## 2023-05-14 DIAGNOSIS — S8012XA Contusion of left lower leg, initial encounter: Secondary | ICD-10-CM | POA: Insufficient documentation

## 2023-05-14 MED ORDER — IBUPROFEN 600 MG PO TABS: 600.0000 mg | ORAL_TABLET | Freq: Four times a day (QID) | ORAL | 0 refills | Status: DC | PRN

## 2023-05-14 MED ORDER — CYCLOBENZAPRINE HCL 10 MG PO TABS: 10.0000 mg | ORAL_TABLET | Freq: Two times a day (BID) | ORAL | 0 refills | Status: DC | PRN
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
## 2023-05-14 NOTE — ED Notes (Signed)
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
## 2023-05-17 DIAGNOSIS — M542 Cervicalgia: Secondary | ICD-10-CM | POA: Diagnosis not present

## 2023-05-17 DIAGNOSIS — M7712 Lateral epicondylitis, left elbow: Secondary | ICD-10-CM | POA: Diagnosis not present

## 2023-06-07 ENCOUNTER — Encounter (HOSPITAL_BASED_OUTPATIENT_CLINIC_OR_DEPARTMENT_OTHER): Payer: Self-pay | Admitting: Emergency Medicine

## 2023-06-07 ENCOUNTER — Other Ambulatory Visit: Payer: Self-pay

## 2023-06-07 ENCOUNTER — Emergency Department (HOSPITAL_BASED_OUTPATIENT_CLINIC_OR_DEPARTMENT_OTHER)
Admission: EM | Admit: 2023-06-07 | Discharge: 2023-06-07 | Disposition: A | Discharge status: Home / Self Care | Attending: Emergency Medicine | Admitting: Emergency Medicine

## 2023-06-07 DIAGNOSIS — Z79899 Other long term (current) drug therapy: Secondary | ICD-10-CM | POA: Insufficient documentation

## 2023-06-07 DIAGNOSIS — Z87891 Personal history of nicotine dependence: Secondary | ICD-10-CM | POA: Diagnosis not present

## 2023-06-07 DIAGNOSIS — Z202 Contact with and (suspected) exposure to infections with a predominantly sexual mode of transmission: Secondary | ICD-10-CM | POA: Insufficient documentation

## 2023-06-07 DIAGNOSIS — I10 Essential (primary) hypertension: Secondary | ICD-10-CM | POA: Diagnosis not present

## 2023-06-07 NOTE — ED Triage Notes (Signed)
 Pt POV steady gait- reports possible STD exposure. Denies sx.
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
## 2023-06-08 LAB — HIV ANTIBODY (ROUTINE TESTING W REFLEX): HIV Screen 4th Generation wRfx: NONREACTIVE

## 2023-06-08 LAB — RPR: RPR Ser Ql: NONREACTIVE

## 2023-06-08 LAB — GC/CHLAMYDIA PROBE AMP (~~LOC~~) NOT AT ARMC
Chlamydia: NEGATIVE
Comment: NEGATIVE
Comment: NORMAL
Neisseria Gonorrhea: NEGATIVE

## 2023-06-13 ENCOUNTER — Other Ambulatory Visit: Payer: Self-pay

## 2023-06-13 ENCOUNTER — Ambulatory Visit: Attending: Orthopedic Surgery

## 2023-06-13 DIAGNOSIS — M79642 Pain in left hand: Secondary | ICD-10-CM | POA: Insufficient documentation

## 2023-06-13 DIAGNOSIS — M5412 Radiculopathy, cervical region: Secondary | ICD-10-CM | POA: Diagnosis present

## 2023-06-13 DIAGNOSIS — M25522 Pain in left elbow: Secondary | ICD-10-CM | POA: Insufficient documentation

## 2023-06-13 DIAGNOSIS — M25512 Pain in left shoulder: Secondary | ICD-10-CM | POA: Insufficient documentation
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
## 2023-06-20 ENCOUNTER — Ambulatory Visit: Admitting: Physical Therapy

## 2023-06-20 ENCOUNTER — Encounter: Payer: Self-pay | Admitting: Physical Therapy

## 2023-06-20 DIAGNOSIS — M79642 Pain in left hand: Secondary | ICD-10-CM

## 2023-06-20 DIAGNOSIS — M5412 Radiculopathy, cervical region: Secondary | ICD-10-CM | POA: Diagnosis not present

## 2023-06-20 DIAGNOSIS — M25512 Pain in left shoulder: Secondary | ICD-10-CM

## 2023-06-20 DIAGNOSIS — M25522 Pain in left elbow: Secondary | ICD-10-CM
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
## 2023-06-28 ENCOUNTER — Ambulatory Visit: Attending: Orthopedic Surgery

## 2023-06-28 DIAGNOSIS — M25522 Pain in left elbow: Secondary | ICD-10-CM | POA: Diagnosis present

## 2023-06-28 DIAGNOSIS — M5412 Radiculopathy, cervical region: Secondary | ICD-10-CM

## 2023-06-28 DIAGNOSIS — M79642 Pain in left hand: Secondary | ICD-10-CM | POA: Insufficient documentation

## 2023-06-28 DIAGNOSIS — M25512 Pain in left shoulder: Secondary | ICD-10-CM | POA: Diagnosis present
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
## 2023-07-05 ENCOUNTER — Ambulatory Visit: Admitting: Physical Therapy

## 2023-07-05 ENCOUNTER — Encounter: Payer: Self-pay | Admitting: Physical Therapy

## 2023-07-05 DIAGNOSIS — M25512 Pain in left shoulder: Secondary | ICD-10-CM

## 2023-07-05 DIAGNOSIS — M5412 Radiculopathy, cervical region: Secondary | ICD-10-CM
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
## 2023-07-11 ENCOUNTER — Ambulatory Visit: Admitting: Physical Therapy

## 2023-07-11 ENCOUNTER — Telehealth: Payer: Self-pay | Admitting: Physical Therapy
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
## 2023-07-11 NOTE — Telephone Encounter (Signed)
 Called pt re: today's missed appt - left voicemail w/ date/time of next appt, advised on attendance policy with first missed appointment, provided office call back number and encouraged to reach out with any questions/concerns.

## 2023-07-12 DIAGNOSIS — M542 Cervicalgia: Secondary | ICD-10-CM | POA: Diagnosis not present

## 2023-07-13 ENCOUNTER — Ambulatory Visit

## 2023-07-13 DIAGNOSIS — M5412 Radiculopathy, cervical region: Secondary | ICD-10-CM | POA: Diagnosis not present

## 2023-07-13 DIAGNOSIS — M79642 Pain in left hand: Secondary | ICD-10-CM

## 2023-07-13 DIAGNOSIS — M25512 Pain in left shoulder: Secondary | ICD-10-CM

## 2023-07-13 DIAGNOSIS — M25522 Pain in left elbow: Secondary | ICD-10-CM
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
## 2023-07-18 ENCOUNTER — Ambulatory Visit

## 2023-07-18 DIAGNOSIS — M25512 Pain in left shoulder: Secondary | ICD-10-CM

## 2023-07-18 DIAGNOSIS — M5412 Radiculopathy, cervical region: Secondary | ICD-10-CM | POA: Diagnosis not present

## 2023-07-18 DIAGNOSIS — M25522 Pain in left elbow: Secondary | ICD-10-CM
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
## 2023-07-20 ENCOUNTER — Ambulatory Visit: Admitting: Physical Therapy

## 2023-07-20 ENCOUNTER — Encounter: Payer: Self-pay | Admitting: Physical Therapy

## 2023-07-20 DIAGNOSIS — M79642 Pain in left hand: Secondary | ICD-10-CM

## 2023-07-20 DIAGNOSIS — M5412 Radiculopathy, cervical region: Secondary | ICD-10-CM

## 2023-07-20 DIAGNOSIS — M25512 Pain in left shoulder: Secondary | ICD-10-CM

## 2023-07-20 DIAGNOSIS — M25522 Pain in left elbow: Secondary | ICD-10-CM
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
## 2023-07-25 ENCOUNTER — Encounter: Admitting: Physical Therapy

## 2023-10-04 ENCOUNTER — Emergency Department (HOSPITAL_BASED_OUTPATIENT_CLINIC_OR_DEPARTMENT_OTHER)
Admission: EM | Admit: 2023-10-04 | Discharge: 2023-10-04 | Disposition: A | Discharge status: Home / Self Care | Attending: Emergency Medicine | Admitting: Emergency Medicine

## 2023-10-04 ENCOUNTER — Other Ambulatory Visit: Payer: Self-pay

## 2023-10-04 ENCOUNTER — Encounter (HOSPITAL_BASED_OUTPATIENT_CLINIC_OR_DEPARTMENT_OTHER): Payer: Self-pay

## 2023-10-04 DIAGNOSIS — Z87891 Personal history of nicotine dependence: Secondary | ICD-10-CM | POA: Insufficient documentation

## 2023-10-04 DIAGNOSIS — M542 Cervicalgia: Secondary | ICD-10-CM | POA: Insufficient documentation

## 2023-10-04 DIAGNOSIS — R6884 Jaw pain: Secondary | ICD-10-CM | POA: Insufficient documentation

## 2023-10-04 DIAGNOSIS — H9202 Otalgia, left ear: Secondary | ICD-10-CM | POA: Insufficient documentation

## 2023-10-04 MED ORDER — CIPROFLOXACIN HCL 500 MG PO TABS: 500.0000 mg | ORAL_TABLET | Freq: Two times a day (BID) | ORAL | 0 refills | Status: AC

## 2023-10-04 MED ORDER — CIPROFLOXACIN HCL 0.3 % OP SOLN: 2.0000 [drp] | Freq: Two times a day (BID) | OPHTHALMIC | 0 refills | Status: DC

## 2023-10-04 MED ORDER — DEXAMETHASONE 0.1 % OP SUSP: 2.0000 [drp] | Freq: Two times a day (BID) | OPHTHALMIC | 0 refills | Status: DC

## 2023-10-04 NOTE — ED Provider Notes (Signed)
 [facility] Provider Note   CSN: [id number] Arrival date & time: 10/04/23  1535     Patient presents with: Ear Drainage   Angel Carpenter is a 38 y.o. female.    Ear Drainage   38 year old female presents emergency department complaints of continued left ear pain.  Has been having left ear pain for the past 8 days or so.  Has been on Augmentin.  Over the past few days, has noticed worsening drainage from her left ear describes it is green in color.  Reports some pain moving her left jaw as well as left-sided neck.  Denies any fevers, chills, changes in hearing from baseline.  Past medical history significant for hypertension, seizure, depression  Prior to Admission medications   Medication Sig Start Date End Date Taking? Authorizing Provider  ciprofloxacin  (CILOXAN ) 0.3 % ophthalmic solution Place 2 drops into the left ear in the morning and at bedtime for 10 days. Administer 1 drop, every 2 hours, while awake, for 2 days. Then 1 drop, every 4 hours, while awake, for the next 5 days. 10/04/23 10/14/23 Yes Silver Fell A, PA  ciprofloxacin  (CIPRO ) 500 MG tablet Take 1 tablet (500 mg total) by mouth every 12 (twelve) hours for 7 days. 10/04/23 10/11/23 Yes Silver Fell A, PA  dexamethasone  (DECADRON ) 0.1 % ophthalmic suspension Place 2 drops into the left ear 2 (two) times daily for 10 days. 10/04/23 10/14/23 Yes Silver Fell A, PA  acetaminophen  (TYLENOL ) 500 MG tablet Take 1,000 mg by mouth every 6 (six) hours as needed for mild pain or headache.    [provider]    Allergies: Sulfa antibiotics    Review of Systems  All other systems reviewed and are negative.   Updated Vital Signs BP (!) 146/93 (BP Location: Left Arm)   Pulse 92   Temp 98.5 F (36.9 C)   Resp 18   SpO2 99%   Physical Exam Vitals and nursing note reviewed.  Constitutional:      General: She is not in acute distress.    Appearance: She is  well-developed.  HENT:     Head: Normocephalic and atraumatic.     Right Ear: Tympanic membrane normal.     Left Ear: Tympanic membrane normal.     Ears:     Comments: Patient with swelling left-sided external auditory canal compared to the right.  Faint amount of yellowish-green exudate present.  TM intact unremarkable.  No obvious mastoid tenderness or erythema overlying the mastoid process.  Prior surgical incision left side where mastoid is present.  Left-sided lymphadenopathy cervical appreciated. Eyes:     Conjunctiva/sclera: Conjunctivae normal.  Cardiovascular:     Rate and Rhythm: Normal rate and regular rhythm.     Heart sounds: No murmur heard. Pulmonary:     Effort: Pulmonary effort is normal. No respiratory distress.     Breath sounds: Normal breath sounds.  Abdominal:     Palpations: Abdomen is soft.     Tenderness: There is no abdominal tenderness.  Musculoskeletal:        General: No swelling.     Cervical back: Neck supple.  Skin:    General: Skin is warm and dry.     Capillary Refill: Capillary refill takes less than 2 seconds.  Neurological:     Mental Status: She is alert.  Psychiatric:        Mood and Affect: Mood normal.     (all labs ordered are  listed, but only abnormal results are displayed) Labs Reviewed - No data to display  EKG: None  Radiology: No results found.   Procedures   Medications Ordered in the ED - No data to display                                  Medical Decision Making Risk Prescription drug management.   This patient presents to the ED for concern of ear pain, this involves an extensive number of treatment options, and is a complaint that carries with it a high risk of complications and morbidity.  The differential diagnosis includes otitis media, otitis externa, necrotizing otitis externa, mastoiditis, other   Co morbidities that complicate the patient evaluation  See HPI   Additional history  obtained:  Additional history obtained from EMR External records from outside source obtained and reviewed including hospital records   Lab Tests:  N/a   Imaging Studies ordered:  N/a   Cardiac Monitoring: / EKG:  N/a   Consultations Obtained:  N/a   Problem List / ED Course / Critical interventions / Medication management  Left ear pain Reevaluation of the patient showed that the patient stayed the same I have reviewed the patients home medicines and have made adjustments as needed   Social Determinants of Health:  Former cigarette use.  Denies illicit drug use.   Test / Admission - Considered:  Left ear pain Vitals signs significant for hypertension blood pressure 146/93. Otherwise within normal range and stable throughout visit. 38 year old female presents emergency department complaints of continued left ear pain.  Has been having left ear pain for the past 8 days or so.  Has been on Augmentin.  Over the past few days, has noticed worsening drainage from her left ear describes it is green in color.  Reports some pain moving her left jaw as well as left-sided neck.  Denies any fevers, chills, changes in hearing from baseline. On exam, exudate appreciated left sided ear canal with swelling appreciated left-sided external auditory canal.  No evidence clinically of mastoiditis.  TMs bilaterally unremarkable.  Given patient's continued symptoms despite oral antibiotics, offered patient labs and CT imaging of temporal bones for further assessment regarding potential mastoiditis although with low clinical evidence but this was declined.  Patient preferred to altering antibiotic therapy and follow-up with ENT in the outpatient setting.  This seems reasonable.  Treatment plan discussed with patient she acknowledged understanding was agreeable.  Patient well-appearing, afebrile in no acute distress. Worrisome signs and symptoms were discussed with the patient, and the patient  acknowledged understanding to return to the ED if noticed. Patient was stable upon discharge.       Final diagnoses:  None    ED Discharge Orders          Ordered    ciprofloxacin  (CIPRO ) 500 MG tablet  Every 12 hours        10/04/23 1604    ciprofloxacin  (CILOXAN ) 0.3 % ophthalmic solution  2 times daily        10/04/23 1604    dexamethasone  (DECADRON ) 0.1 % ophthalmic suspension  2 times daily        10/04/23 1604               Silver Wonda LABOR, [place] 10/04/23 1634    Lenor Hollering, MD 10/04/23 2318

## 2023-10-04 NOTE — ED Triage Notes (Signed)
 Reports no relief from abx for L ear infection for 8 days. Reports tenderness around ear, neck and jaw, green drainage. Denies fevers.

## 2023-10-04 NOTE — ED Notes (Signed)
 Patient Alert and oriented to baseline. Stable and ambulatory to baseline. Patient verbalized understanding of the discharge instructions.  Patient belongings were taken by the patient.

## 2023-10-04 NOTE — Discharge Instructions (Signed)
 As discussed, you have an outer ear infection.  Will treat this with topical antibiotics.  Will also change her to oral antibiotic.  Recommend follow-up with ENT in the outpatient setting.

## 2023-10-05 NOTE — ED Notes (Signed)
 Patient called in and stated wrong medications called into [facility].  Reviewed Discharge papers.  Patient will take discharge papers to [facility] and verfiy medications.  Will call back if any issues.

## 2023-10-07 ENCOUNTER — Telehealth (HOSPITAL_BASED_OUTPATIENT_CLINIC_OR_DEPARTMENT_OTHER): Payer: Self-pay | Admitting: Physician Assistant

## 2023-10-07 MED ORDER — NEOMYCIN-POLYMYXIN-HC 3.5-10000-1 OT SUSP: 3.0000 [drp] | Freq: Three times a day (TID) | OTIC | 0 refills | Status: AC

## 2023-11-29 ENCOUNTER — Other Ambulatory Visit: Payer: Self-pay

## 2023-11-29 ENCOUNTER — Encounter (HOSPITAL_BASED_OUTPATIENT_CLINIC_OR_DEPARTMENT_OTHER): Payer: Self-pay

## 2023-11-29 ENCOUNTER — Emergency Department (HOSPITAL_BASED_OUTPATIENT_CLINIC_OR_DEPARTMENT_OTHER): Payer: Worker's Compensation

## 2023-11-29 ENCOUNTER — Emergency Department (HOSPITAL_BASED_OUTPATIENT_CLINIC_OR_DEPARTMENT_OTHER)
Admission: EM | Admit: 2023-11-29 | Discharge: 2023-11-29 | Disposition: A | Discharge status: Home / Self Care | Payer: Worker's Compensation | Attending: Emergency Medicine | Admitting: Emergency Medicine

## 2023-11-29 DIAGNOSIS — Y990 Civilian activity done for income or pay: Secondary | ICD-10-CM | POA: Insufficient documentation

## 2023-11-29 DIAGNOSIS — M70812 Other soft tissue disorders related to use, overuse and pressure, left shoulder: Secondary | ICD-10-CM | POA: Insufficient documentation

## 2023-11-29 DIAGNOSIS — Y939 Activity, unspecified: Secondary | ICD-10-CM | POA: Insufficient documentation

## 2023-11-29 DIAGNOSIS — M779 Enthesopathy, unspecified: Secondary | ICD-10-CM

## 2023-11-29 DIAGNOSIS — I10 Essential (primary) hypertension: Secondary | ICD-10-CM | POA: Diagnosis not present

## 2023-11-29 DIAGNOSIS — M25512 Pain in left shoulder: Secondary | ICD-10-CM | POA: Diagnosis present

## 2023-11-29 DIAGNOSIS — M70832 Other soft tissue disorders related to use, overuse and pressure, left forearm: Secondary | ICD-10-CM | POA: Insufficient documentation

## 2023-11-29 MED ORDER — KETOROLAC TROMETHAMINE 30 MG/ML IJ SOLN
30.0000 mg | Freq: Once | INTRAMUSCULAR | Status: AC
  Administered 2023-11-29: 30 mg via INTRAMUSCULAR
  Filled 2023-11-29: qty 1

## 2023-11-29 MED ORDER — MELOXICAM 15 MG PO TABS: 15.0000 mg | ORAL_TABLET | Freq: Every day | ORAL | 0 refills | Status: AC

## 2023-11-29 NOTE — Discharge Instructions (Signed)
 Please take the meloxicam as prescribed.  Do not take ibuprofen , Aleve , aspirin with this medicine as they are similar.  You can take additional Tylenol  if you need some additional pain relief.  I am taking out of work for the next 2 days to help you rest.  I would like for you to follow-up with the orthopedic surgeon in the coming week.

## 2023-11-29 NOTE — ED Notes (Signed)

## 2023-11-29 NOTE — ED Triage Notes (Signed)
 Pt reports left shoulder blade pain and left elbow. 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  No pain at this time Elbow locks up and shoulder feels sore

## 2023-11-29 NOTE — ED Provider Notes (Signed)
 Emergency Department Provider Note   I have reviewed the triage vital signs and the nursing notes.   HISTORY  Chief Complaint Arm Pain   HPI Tita Terhaar is a 38 y.o. female with past history of hypertension presents to the emergency department with left shoulder and elbow pain.  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  Week ago she developed pain in her left shoulder which radiates to the elbow.  She feels soreness and feel like the joints lock up at times. No fever. No falls or specific injury.   Past Medical History:  Diagnosis Date   Depression    Hypertension    Seizures (HCC)     Review of Systems  Constitutional: No fever/chills Cardiovascular: Denies chest pain. Respiratory: Denies shortness of breath. Gastrointestinal: No abdominal pain.   Musculoskeletal: Positive left shoulder and elbow pain.  Skin: Negative for rash. Neurological: Negative for headaches, focal weakness or numbness.   ____________________________________________   PHYSICAL EXAM:  VITAL SIGNS: ED Triage Vitals  Encounter Vitals Group     BP 11/29/23 0845 124/76     Pulse Rate 11/29/23 0845 79     Resp 11/29/23 0851 16     Temp 11/29/23 0851 98.3 F (36.8 C)     Temp src --      SpO2 11/29/23 0845 100 %     Weight 11/29/23 0851 146 lb (66.2 kg)   Constitutional: Alert and oriented. Well appearing and in no acute distress. Eyes: Conjunctivae are normal. Head: Atraumatic. Nose: No congestion/rhinnorhea. Mouth/Throat: Mucous membranes are moist.   Neck: No stridor.   Cardiovascular: Normal rate, regular rhythm. Good peripheral circulation. Grossly normal heart sounds.   Respiratory: Normal respiratory effort.  No retractions. Lungs CTAB. Gastrointestinal: Soft and nontender. No distention.  Musculoskeletal: Normal active and passive range of motion of the left shoulder and elbow.  No large effusion or warmth  to touch.  Compartments are soft. No midline c spine tenderness.  Neurologic:  Normal speech and language. Skin:  Skin is warm, dry and intact. No rash noted.   __________________________________________  RADIOLOGY  DG Elbow Complete Left Result Date: 11/29/2023 CLINICAL DATA:  Lifting injury 1 week ago with left elbow pain. EXAM: LEFT ELBOW - COMPLETE 3+ VIEW COMPARISON:  None Available. FINDINGS: There is no evidence of fracture, dislocation, or joint effusion. There is no evidence of arthropathy or other focal bone abnormality. Soft tissues are unremarkable. IMPRESSION: Negative. Electronically Signed   By: Toribio Agreste M.D.   On: 11/29/2023 09:35   DG Shoulder Left Result Date: 11/29/2023 CLINICAL DATA:  Lifting injury 1 week ago with left shoulder pain. EXAM: LEFT SHOULDER - 2+ VIEW COMPARISON:  None Available. FINDINGS: There is no evidence of fracture or dislocation. There is no evidence of arthropathy or other focal bone abnormality. Soft tissues are unremarkable. IMPRESSION: Negative. Electronically Signed   By: Toribio Agreste M.D.   On: 11/29/2023 09:34    ____________________________________________   PROCEDURES  Procedure(s) performed:   Procedures  None  ____________________________________________   INITIAL IMPRESSION / ASSESSMENT AND PLAN / ED COURSE  Pertinent labs & imaging results that were available during my care of the patient were reviewed by me and considered in my medical decision making (see chart for details).   This patient is Presenting for Evaluation of arm pain, which does require a range of treatment options, and is a complaint that involves a moderate risk of  morbidity and mortality.  The Differential Diagnoses include sprain, tendonitis, fracture, dislocation, etc.  Critical Interventions-    Medications  ketorolac  (TORADOL ) 30 MG/ML injection 30 mg (30 mg Intramuscular Given 11/29/23 0925)    Reassessment after intervention:  pain improved.     Radiologic Tests Ordered, included shoulder and elbow XR. I independently interpreted the images and agree with radiology interpretation.   Medical Decision Making: Summary:  Patient presents to the emergency department with left shoulder and elbow pain seems most consistent with tendinitis.  No obvious rotator cuff injury or tear.  The extremity is neurovascularly intact with soft compartments.  No large joint effusions.  Plan for imaging.  Reevaluation with update and discussion with patient.  X-ray unremarkable.  Plan for meloxicam and orthopedic follow-up.  Work note provided.   Patient's presentation is most consistent with acute, uncomplicated illness.   Disposition: discharge  ____________________________________________  FINAL CLINICAL IMPRESSION(S) / ED DIAGNOSES  Final diagnoses:  Tendonitis     NEW OUTPATIENT MEDICATIONS STARTED DURING THIS VISIT:  New Prescriptions   MELOXICAM (MOBIC) 15 MG TABLET    Take 1 tablet (15 mg total) by mouth daily for 7 days.    Note:  This document was prepared using Dragon voice recognition software and may include unintentional dictation errors.  Fonda Law, MD, [facility] Emergency Medicine    Phylisha Dix, Fonda MATSU, MD 11/29/23 [phone]

## 2024-02-14 IMAGING — DX DG CHEST 1V PORT
1 series · 1 of 1 positions shown · non-contrast
Comparison: January 18, 2017.

CLINICAL DATA: Chest pain.

EXAM:
PORTABLE CHEST 1 VIEW

[chest]
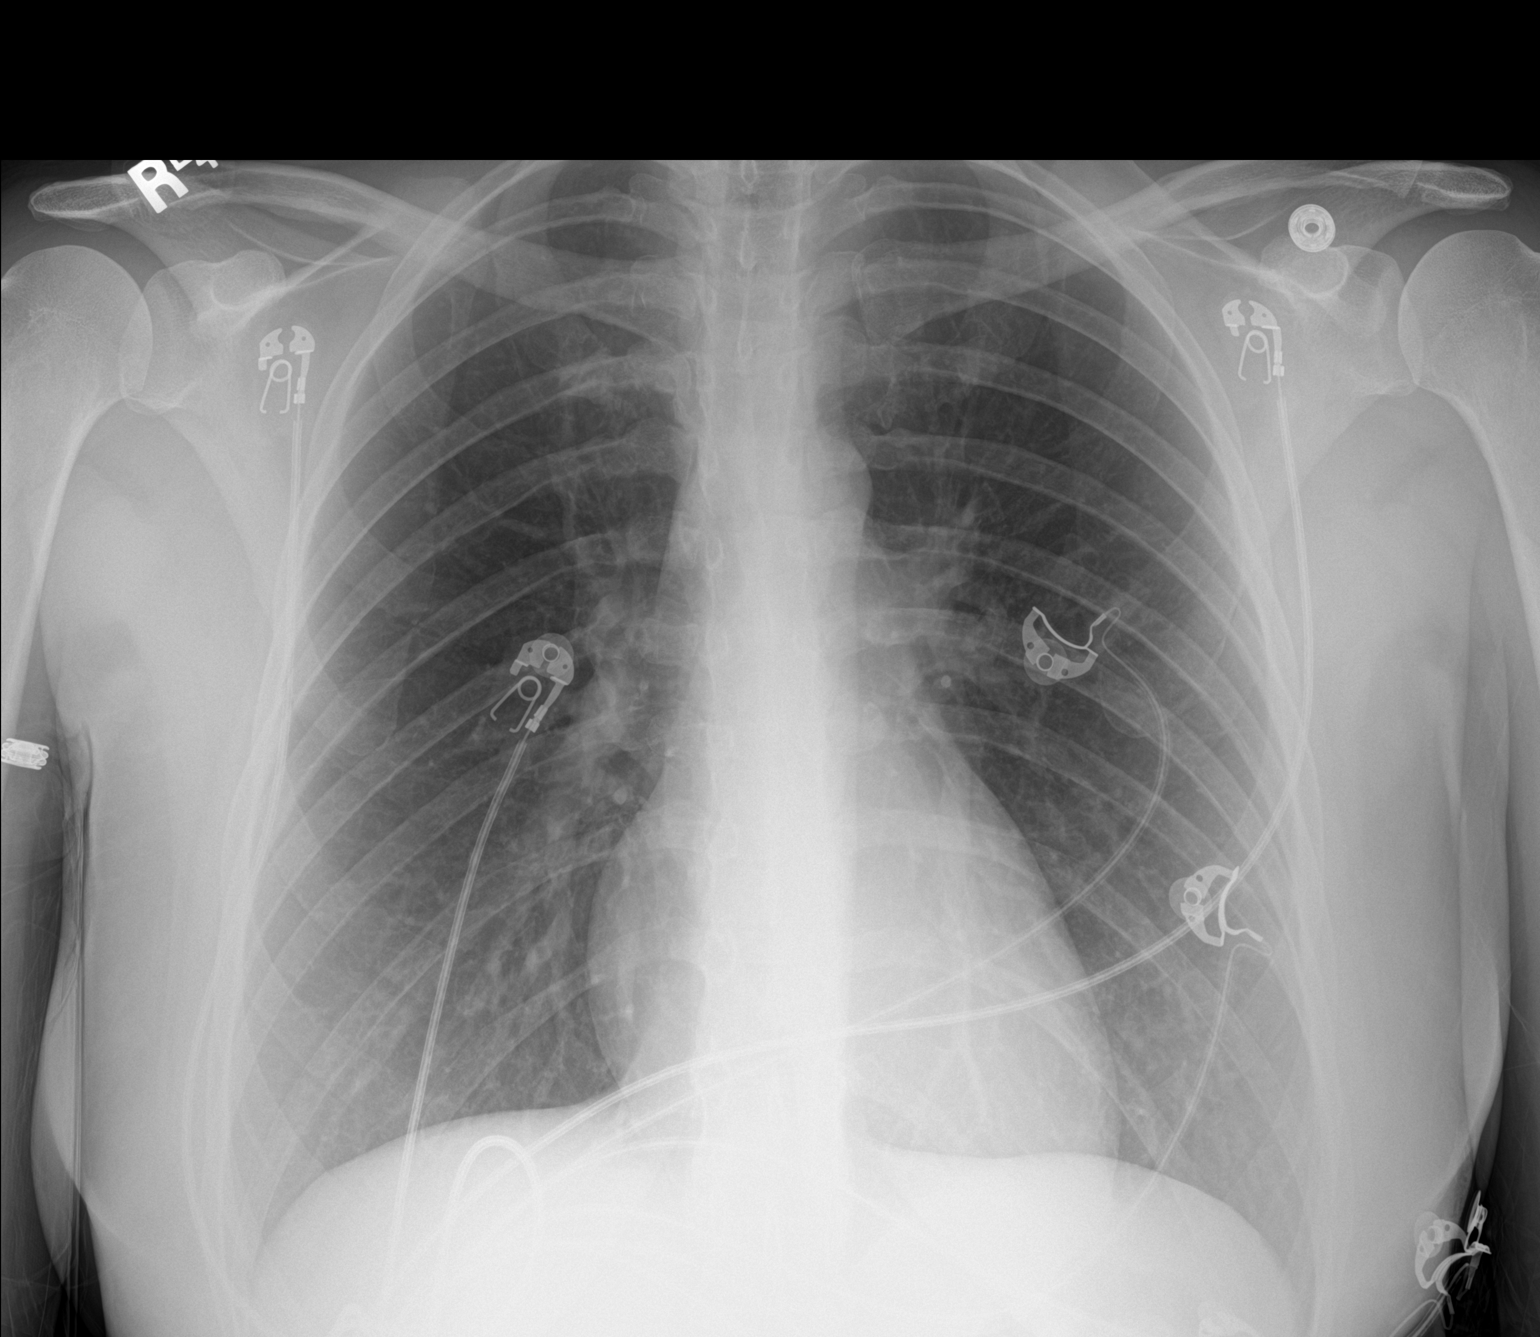

[1 of 1 positions shown; findings below may reference images not displayed]

FINDINGS: The heart size and mediastinal contours are within normal limits.
Both lungs are clear. The visualized skeletal structures are
unremarkable.
IMPRESSION: No active disease.
